# Patient Record
Sex: Male | Born: 1965 | Race: White | Hispanic: No | Marital: Single | Smoking: Never smoker
Health system: Southern US, Community
[De-identification: ages and names within clinical notes are randomized; demographics above are authoritative.]

## PROBLEM LIST (undated history)

## (undated) DIAGNOSIS — M5430 Sciatica, unspecified side: Secondary | ICD-10-CM

## (undated) DIAGNOSIS — I10 Essential (primary) hypertension: Secondary | ICD-10-CM

---

## 2018-12-18 ENCOUNTER — Encounter (HOSPITAL_COMMUNITY): Payer: Self-pay | Admitting: Emergency Medicine

## 2018-12-18 ENCOUNTER — Emergency Department (HOSPITAL_COMMUNITY)
Admission: EM | Admit: 2018-12-18 | Discharge: 2018-12-18 | Disposition: A | Discharge status: Home / Self Care | Payer: Self-pay | Attending: Emergency Medicine | Admitting: Emergency Medicine

## 2018-12-18 ENCOUNTER — Other Ambulatory Visit: Payer: Self-pay

## 2018-12-18 DIAGNOSIS — R7989 Other specified abnormal findings of blood chemistry: Secondary | ICD-10-CM | POA: Insufficient documentation

## 2018-12-18 DIAGNOSIS — R112 Nausea with vomiting, unspecified: Secondary | ICD-10-CM | POA: Insufficient documentation

## 2018-12-18 DIAGNOSIS — I10 Essential (primary) hypertension: Secondary | ICD-10-CM | POA: Insufficient documentation

## 2018-12-18 HISTORY — DX: Sciatica, unspecified side: M54.30

## 2018-12-18 HISTORY — DX: Essential (primary) hypertension: I10

## 2018-12-18 LAB — CBC
HCT: 44.2 % (ref 39.0–52.0)
Hemoglobin: 13.4 g/dL (ref 13.0–17.0)
MCH: 26.7 pg (ref 26.0–34.0)
MCHC: 30.3 g/dL (ref 30.0–36.0)
MCV: 88.2 fL (ref 80.0–100.0)
Platelets: 507 10*3/uL — ABNORMAL HIGH (ref 150–400)
RBC: 5.01 MIL/uL (ref 4.22–5.81)
RDW: 14.6 % (ref 11.5–15.5)
WBC: 15.1 10*3/uL — ABNORMAL HIGH (ref 4.0–10.5)
nRBC: 0 % (ref 0.0–0.2)

## 2018-12-18 LAB — COMPREHENSIVE METABOLIC PANEL
ALT: 26 U/L (ref 0–44)
AST: 43 U/L — ABNORMAL HIGH (ref 15–41)
Albumin: 3.1 g/dL — ABNORMAL LOW (ref 3.5–5.0)
Alkaline Phosphatase: 819 U/L — ABNORMAL HIGH (ref 38–126)
Anion gap: 19 — ABNORMAL HIGH (ref 5–15)
BUN: 19 mg/dL (ref 6–20)
CO2: 20 mmol/L — ABNORMAL LOW (ref 22–32)
Calcium: 9.6 mg/dL (ref 8.9–10.3)
Chloride: 100 mmol/L (ref 98–111)
Creatinine, Ser: 1.26 mg/dL — ABNORMAL HIGH (ref 0.61–1.24)
GFR calc Af Amer: >60 mL/min (ref >60)
GFR calc non Af Amer: >60 mL/min (ref >60)
Glucose, Bld: 82 mg/dL (ref 70–99)
Potassium: 4 mmol/L (ref 3.5–5.1)
Sodium: 139 mmol/L (ref 135–145)
Total Bilirubin: 1.7 mg/dL — ABNORMAL HIGH (ref 0.3–1.2)
Total Protein: 7.8 g/dL (ref 6.5–8.1)

## 2018-12-18 LAB — LIPASE, BLOOD: Lipase: 22 U/L (ref 11–51)

## 2018-12-18 MED ORDER — ONDANSETRON HCL 4 MG/2ML IJ SOLN
4.0000 mg | Freq: Once | INTRAMUSCULAR | Status: AC
  Administered 2018-12-18: 20:00:00 4 mg via INTRAVENOUS
  Filled 2018-12-18: qty 2

## 2018-12-18 MED ORDER — SODIUM CHLORIDE 0.9 % IV BOLUS
1000.0000 mL | Freq: Once | INTRAVENOUS | Status: AC
  Administered 2018-12-18: 1000 mL via INTRAVENOUS

## 2018-12-18 MED ORDER — ONDANSETRON 4 MG PO TBDP: 4.0000 mg | ORAL_TABLET | Freq: Three times a day (TID) | ORAL | 0 refills | Status: DC | PRN

## 2018-12-18 NOTE — Discharge Instructions (Signed)
Please read - discharge instructions and summary  As we discussed today in the emergency department, you are showing signs of mild dehydration, including a slightly abnormal creatinine level (level 1.26).  Creatinine is a measurement of your kidney function.  It is likely abnormal because of your poor fluid intake.  Therefore recommending that he continue to make efforts to keep drinking water, Gatorade, or any other fluids at home.  It is very important that your primary care doctor recheck this level in the next 1 to 2 weeks to make sure that your kidneys are okay.  We also discussed 1 of the other abnormalities in your blood work, which is your alkaline phosphatase level.  This was abnormally high.  This may be a sign of issues with your gallbladder.  We offered you an ultrasound of your gallbladder but he did not want the ultrasound done at this time.  We explained to you that an infected or distended gallbladder could be something that needs urgent surgery.  I recommended you get the scan done as an outpatient and you told us that you would.  If you develop worsening abdominal pain, fevers, intractable vomiting or you cannot keep down fluids, return to the emergency department immediately.  Finally we gave you a GI doctor's number to establish follow-up care for your vomiting.

## 2018-12-18 NOTE — ED Provider Notes (Signed)
[facility] DEPT Provider Note   CSN: [id number] Arrival date & time: 12/18/18  1321     History   Chief Complaint Chief Complaint  Patient presents with  . Emesis    HPI Joel Maxwell is a 53 y.o. male with a history of static and hypertension presenting to the emergency department abdominal pain and vomiting.  He reports episodes of nausea and vomiting after eating for the past 5 weeks.  He says this been occurring on a daily basis.  He says it is worse after eating solid food.  He typically feels that he is to vomit within an hour of eating food.   This is never happened to him before.  He denies any abdominal pain except for a sense of "fullness" prior to vomiting.  He otherwise has no focal abdominal pain.  He is passing gas and having bowel movements daily.  He has no dysuria hematuria or problems urinating.  He denies choking sensation or feeling food is getting stuck in his throat.  He has not seen a GI doctor.  He currently is seeing a chiropractor for sciatica and back problems.  He denies any abdominal surgical history.  He says he has been taking Advil up to 400 mg every 6 hours for the past several days, which takes his back and abdominal pain from a 10 down to about a 4 or 5.  He came to the emergency department today because he attempted to take Advil and eat a small meal, but immediately vomited it up.  He says he is sick of this.    HPI  Past Medical History:  Diagnosis Date  . Hypertension   . Sciatica     There are no active problems to display for this patient.   History reviewed. No pertinent surgical history.      Home Medications    Prior to Admission medications   Medication Sig Start Date End Date Taking? Authorizing Provider  ondansetron (ZOFRAN ODT) 4 MG disintegrating tablet Take 1 tablet (4 mg total) by mouth every 8 (eight) hours as needed for up to 15 doses for nausea or vomiting. 12/18/18   Octave Montrose, Carola Rhine, MD    Family History No family history on file.  Social History Social History   Tobacco Use  . Smoking status: Never Smoker  . Smokeless tobacco: Never Used  Substance Use Topics  . Alcohol use: Not on file  . Drug use: Not on file     Allergies   Patient has no known allergies.   Review of Systems Review of Systems  Constitutional: Negative for chills and fever.  Respiratory: Negative for cough and shortness of breath.   Cardiovascular: Negative for chest pain and palpitations.  Gastrointestinal: Positive for abdominal pain and nausea. Negative for constipation, diarrhea and vomiting.  Genitourinary: Negative for dysuria and hematuria.  Skin: Negative for pallor and rash.  Neurological: Negative for seizures and syncope.  Psychiatric/Behavioral: Negative for agitation and confusion.  All other systems reviewed and are negative.    Physical Exam Updated Vital Signs BP (!) 152/101 (BP Location: Right Arm)   Pulse (!) 107   Temp 97.9 F (36.6 C) (Oral)   Resp 16   SpO2 96%   Physical Exam Vitals signs and nursing note reviewed.  Constitutional:      Appearance: He is well-developed.  HENT:     Head: Normocephalic and atraumatic.  Eyes:     Conjunctiva/sclera: Conjunctivae normal.  Neck:     Musculoskeletal: Neck supple.  Cardiovascular:     Rate and Rhythm: Normal rate and regular rhythm.     Pulses: Normal pulses.     Heart sounds: No murmur.  Pulmonary:     Effort: Pulmonary effort is normal. No respiratory distress.     Breath sounds: Normal breath sounds.  Abdominal:     General: Bowel sounds are normal.     Palpations: Abdomen is soft.     Tenderness: There is no abdominal tenderness. There is no right CVA tenderness, left CVA tenderness, guarding or rebound. Negative signs include Murphy's sign, Rovsing's sign, McBurney's sign and psoas sign.     Hernia: No hernia is present.  Skin:    General: Skin is warm and dry.  Neurological:      General: No focal deficit present.     Mental Status: He is alert and oriented to person, place, and time.  Psychiatric:        Mood and Affect: Mood normal.        Behavior: Behavior normal.      ED Treatments / Results  Labs (all labs ordered are listed, but only abnormal results are displayed) Labs Reviewed  COMPREHENSIVE METABOLIC PANEL - Abnormal; Notable for the following components:      Result Value   CO2 20 (*)    Creatinine, Ser 1.26 (*)    Albumin 3.1 (*)    AST 43 (*)    Alkaline Phosphatase 819 (*)    Total Bilirubin 1.7 (*)    Anion gap 19 (*)    All other components within normal limits  CBC - Abnormal; Notable for the following components:   WBC 15.1 (*)    Platelets 507 (*)    All other components within normal limits  LIPASE, BLOOD    EKG None  Radiology No results found.  Procedures Procedures (including critical care time)  Medications Ordered in ED Medications  ondansetron (ZOFRAN) injection 4 mg (4 mg Intravenous Given 12/18/18 1931)  sodium chloride 0.9 % bolus 1,000 mL (0 mLs Intravenous Stopped 12/18/18 2015)     Initial Impression / Assessment and Plan / ED Course  I have reviewed the triage vital signs and the nursing notes.  Pertinent labs & imaging results that were available during my care of the patient were reviewed by me and considered in my medical decision making (see chart for details).  53 yo male presenting with persistent nausea and vomiting after meals for 5 weeks.  Difficulty keeping down PO, although fluids are better than solid foods.  He is well appearing on exam, does not appear dehydrated, and has no focal abdominal tenderness.  I suspect he may have gastroparesis although it is unclear what the inciting issue may be.  He reports no prior illness before this started.  His other doctors believe it may be related to sciatica pain (he has very bad back pain at baseline), but he is unsure whether his vomiting is triggered by  acute pain.  He simply feels he cannot keep food down.  He's passing gas and having BM - lower suspicion for SBO.  He appears quite comfortable on exam, with no abdominal tenderness.    Labs demonstrated elevated alk phos - possible gall bladder disease?  Will obtain RUQ U/S  Otherwise we discussed him following up with a GI doctor for further GI motility studies and workup.  He agrees with this.  He states, "I'm fine at home  doc, I just need something for nausea when I eat."  We also discussed limiting his heavy solid food intake as he has been favoring steak, fast food, and other meats.  I suggested liquid diet, BOOST and ensures, and he agrees with this plan.  Clinical Course as of Dec 18 44  Sat Dec 18, 2018  1908 The patient is refusing ultrasound for religious reasons.  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  I explained to him that his labs show an abnormality including high alkaline phosphatase which could be a sign of gallbladder disease.  This may be causing his repeat nausea and vomiting.  I explained that his gallbladder may be inflamed and need surgical removal, even if he has no symptoms at this point in time.  It could lead to a more serious or life threatening infection. He verbalized understanding of this but still does not want any imaging done, including CT scan.  He says "I feel fine, I do not need it today".  I believe he has capacity to make this decision and demonstrates an understanding of the risks.  I will give him a liter of fluids and IV Zofran.  I will give him his labs to follow-up with his primary care doctor, as well as a GI referral.  I strongly encouraged him to get the ultrasound done as an outpatient and ask his PCP about this.    [MT]    Clinical Course User Index [MT] Wyvonnia Dusky, MD      Final Clinical Impressions(s) / ED Diagnoses   Final diagnoses:  Non-intractable vomiting with nausea, unspecified  vomiting type  Elevated serum creatinine    ED Discharge Orders         Ordered    ondansetron (ZOFRAN ODT) 4 MG disintegrating tablet  Every 8 hours PRN     12/18/18 1959           Wyvonnia Dusky, MD 12/19/18 [phone]

## 2018-12-18 NOTE — ED Triage Notes (Signed)
Pt reports vomiting everyday for past 5.5 weeks. Lost weight over it. Pt sees a chiropractor and they feel the pain from sciatica is what is causing the vomiting. Reports doesn't have PCP.

## 2019-02-04 ENCOUNTER — Emergency Department (HOSPITAL_COMMUNITY): Payer: Self-pay

## 2019-02-04 ENCOUNTER — Encounter (HOSPITAL_COMMUNITY): Payer: Self-pay | Admitting: Emergency Medicine

## 2019-02-04 ENCOUNTER — Observation Stay (HOSPITAL_COMMUNITY)
Admission: EM | Admit: 2019-02-04 | Discharge: 2019-02-05 | Disposition: A | Discharge status: Home / Self Care | Payer: Self-pay | Attending: Family Medicine | Admitting: Family Medicine

## 2019-02-04 DIAGNOSIS — Z20828 Contact with and (suspected) exposure to other viral communicable diseases: Secondary | ICD-10-CM | POA: Insufficient documentation

## 2019-02-04 DIAGNOSIS — M549 Dorsalgia, unspecified: Secondary | ICD-10-CM

## 2019-02-04 DIAGNOSIS — C787 Secondary malignant neoplasm of liver and intrahepatic bile duct: Secondary | ICD-10-CM | POA: Insufficient documentation

## 2019-02-04 DIAGNOSIS — K769 Liver disease, unspecified: Secondary | ICD-10-CM

## 2019-02-04 DIAGNOSIS — G8929 Other chronic pain: Principal | ICD-10-CM | POA: Insufficient documentation

## 2019-02-04 DIAGNOSIS — E876 Hypokalemia: Secondary | ICD-10-CM

## 2019-02-04 DIAGNOSIS — C187 Malignant neoplasm of sigmoid colon: Secondary | ICD-10-CM | POA: Insufficient documentation

## 2019-02-04 DIAGNOSIS — C7972 Secondary malignant neoplasm of left adrenal gland: Secondary | ICD-10-CM | POA: Insufficient documentation

## 2019-02-04 DIAGNOSIS — E871 Hypo-osmolality and hyponatremia: Secondary | ICD-10-CM | POA: Insufficient documentation

## 2019-02-04 DIAGNOSIS — N179 Acute kidney failure, unspecified: Secondary | ICD-10-CM

## 2019-02-04 DIAGNOSIS — M545 Low back pain, unspecified: Secondary | ICD-10-CM

## 2019-02-04 DIAGNOSIS — R112 Nausea with vomiting, unspecified: Secondary | ICD-10-CM | POA: Insufficient documentation

## 2019-02-04 DIAGNOSIS — I10 Essential (primary) hypertension: Secondary | ICD-10-CM | POA: Insufficient documentation

## 2019-02-04 DIAGNOSIS — Z66 Do not resuscitate: Secondary | ICD-10-CM | POA: Insufficient documentation

## 2019-02-04 DIAGNOSIS — M5441 Lumbago with sciatica, right side: Secondary | ICD-10-CM | POA: Insufficient documentation

## 2019-02-04 DIAGNOSIS — R634 Abnormal weight loss: Secondary | ICD-10-CM | POA: Insufficient documentation

## 2019-02-04 DIAGNOSIS — R918 Other nonspecific abnormal finding of lung field: Secondary | ICD-10-CM

## 2019-02-04 DIAGNOSIS — C7971 Secondary malignant neoplasm of right adrenal gland: Secondary | ICD-10-CM | POA: Insufficient documentation

## 2019-02-04 DIAGNOSIS — R948 Abnormal results of function studies of other organs and systems: Secondary | ICD-10-CM

## 2019-02-04 DIAGNOSIS — R7989 Other specified abnormal findings of blood chemistry: Secondary | ICD-10-CM | POA: Insufficient documentation

## 2019-02-04 DIAGNOSIS — C799 Secondary malignant neoplasm of unspecified site: Secondary | ICD-10-CM | POA: Diagnosis present

## 2019-02-04 DIAGNOSIS — C7951 Secondary malignant neoplasm of bone: Secondary | ICD-10-CM | POA: Insufficient documentation

## 2019-02-04 DIAGNOSIS — C7801 Secondary malignant neoplasm of right lung: Secondary | ICD-10-CM | POA: Insufficient documentation

## 2019-02-04 DIAGNOSIS — Z79899 Other long term (current) drug therapy: Secondary | ICD-10-CM | POA: Insufficient documentation

## 2019-02-04 LAB — PROTIME-INR
INR: 1.4 — ABNORMAL HIGH (ref 0.8–1.2)
Prothrombin Time: 16.8 seconds — ABNORMAL HIGH (ref 11.4–15.2)

## 2019-02-04 LAB — CBC WITH DIFFERENTIAL/PLATELET
Abs Immature Granulocytes: 0.2 10*3/uL — ABNORMAL HIGH (ref 0.00–0.07)
Basophils Absolute: 0.1 10*3/uL (ref 0.0–0.1)
Basophils Relative: 0 %
Eosinophils Absolute: 0.2 10*3/uL (ref 0.0–0.5)
Eosinophils Relative: 1 %
HCT: 40.9 % (ref 39.0–52.0)
Hemoglobin: 12.8 g/dL — ABNORMAL LOW (ref 13.0–17.0)
Immature Granulocytes: 1 %
Lymphocytes Relative: 8 %
Lymphs Abs: 1.5 10*3/uL (ref 0.7–4.0)
MCH: 26.6 pg (ref 26.0–34.0)
MCHC: 31.3 g/dL (ref 30.0–36.0)
MCV: 85 fL (ref 80.0–100.0)
Monocytes Absolute: 1.2 10*3/uL — ABNORMAL HIGH (ref 0.1–1.0)
Monocytes Relative: 6 %
Neutro Abs: 15.2 10*3/uL — ABNORMAL HIGH (ref 1.7–7.7)
Neutrophils Relative %: 84 %
Platelets: 325 10*3/uL (ref 150–400)
RBC: 4.81 MIL/uL (ref 4.22–5.81)
RDW: 18.6 % — ABNORMAL HIGH (ref 11.5–15.5)
WBC: 18.3 10*3/uL — ABNORMAL HIGH (ref 4.0–10.5)
nRBC: 0 % (ref 0.0–0.2)

## 2019-02-04 LAB — COMPREHENSIVE METABOLIC PANEL
ALT: 31 U/L (ref 0–44)
AST: 124 U/L — ABNORMAL HIGH (ref 15–41)
Albumin: 2.5 g/dL — ABNORMAL LOW (ref 3.5–5.0)
Alkaline Phosphatase: 1807 U/L — ABNORMAL HIGH (ref 38–126)
Anion gap: 15 (ref 5–15)
BUN: 35 mg/dL — ABNORMAL HIGH (ref 6–20)
CO2: 20 mmol/L — ABNORMAL LOW (ref 22–32)
Calcium: 10.5 mg/dL — ABNORMAL HIGH (ref 8.9–10.3)
Chloride: 94 mmol/L — ABNORMAL LOW (ref 98–111)
Creatinine, Ser: 1.7 mg/dL — ABNORMAL HIGH (ref 0.61–1.24)
GFR calc Af Amer: 52 mL/min — ABNORMAL LOW (ref >60)
GFR calc non Af Amer: 45 mL/min — ABNORMAL LOW (ref >60)
Glucose, Bld: 103 mg/dL — ABNORMAL HIGH (ref 70–99)
Potassium: 3.3 mmol/L — ABNORMAL LOW (ref 3.5–5.1)
Sodium: 129 mmol/L — ABNORMAL LOW (ref 135–145)
Total Bilirubin: 2.3 mg/dL — ABNORMAL HIGH (ref 0.3–1.2)
Total Protein: 6.8 g/dL (ref 6.5–8.1)

## 2019-02-04 LAB — URINALYSIS, ROUTINE W REFLEX MICROSCOPIC
Bilirubin Urine: NEGATIVE
Glucose, UA: NEGATIVE mg/dL
Hgb urine dipstick: NEGATIVE
Ketones, ur: 5 mg/dL — AB
Leukocytes,Ua: NEGATIVE
Nitrite: NEGATIVE
Protein, ur: NEGATIVE mg/dL
Specific Gravity, Urine: >1.046 — ABNORMAL HIGH (ref 1.005–1.030)
pH: 6 (ref 5.0–8.0)

## 2019-02-04 LAB — PSA: Prostatic Specific Antigen: 1.17 ng/mL (ref 0.00–4.00)

## 2019-02-04 LAB — LIPASE, BLOOD: Lipase: 18 U/L (ref 11–51)

## 2019-02-04 LAB — SARS CORONAVIRUS 2 BY RT PCR (HOSPITAL ORDER, PERFORMED IN ~~LOC~~ HOSPITAL LAB): SARS Coronavirus 2: NEGATIVE

## 2019-02-04 MED ORDER — IOHEXOL 300 MG/ML  SOLN
100.0000 mL | Freq: Once | INTRAMUSCULAR | Status: AC | PRN
  Administered 2019-02-04: 12:00:00 100 mL via INTRAVENOUS

## 2019-02-04 MED ORDER — ONDANSETRON HCL 4 MG PO TABS: 4.0000 mg | ORAL_TABLET | Freq: Four times a day (QID) | ORAL | Status: DC | PRN

## 2019-02-04 MED ORDER — SODIUM CHLORIDE 0.9 % IV BOLUS
1000.0000 mL | Freq: Once | INTRAVENOUS | Status: AC
  Administered 2019-02-04: 10:00:00 1000 mL via INTRAVENOUS

## 2019-02-04 MED ORDER — ACETAMINOPHEN 325 MG PO TABS
650.0000 mg | ORAL_TABLET | Freq: Four times a day (QID) | ORAL | Status: DC | PRN
  Administered 2019-02-05: 09:00:00 650 mg via ORAL
  Filled 2019-02-04: qty 2

## 2019-02-04 MED ORDER — ONDANSETRON HCL 4 MG/2ML IJ SOLN
4.0000 mg | Freq: Four times a day (QID) | INTRAMUSCULAR | Status: DC | PRN
  Administered 2019-02-04: 4 mg via INTRAVENOUS
  Filled 2019-02-04: qty 2

## 2019-02-04 MED ORDER — SODIUM CHLORIDE 0.9 % IV SOLN
INTRAVENOUS | Status: DC
  Administered 2019-02-04 – 2019-02-05 (×2): via INTRAVENOUS

## 2019-02-04 MED ORDER — SODIUM CHLORIDE (PF) 0.9 % IJ SOLN
INTRAMUSCULAR | Status: AC
  Filled 2019-02-04: qty 50

## 2019-02-04 MED ORDER — POTASSIUM CHLORIDE CRYS ER 20 MEQ PO TBCR
40.0000 meq | EXTENDED_RELEASE_TABLET | Freq: Once | ORAL | Status: AC
  Administered 2019-02-04: 40 meq via ORAL
  Filled 2019-02-04: qty 2

## 2019-02-04 MED ORDER — ACETAMINOPHEN 650 MG RE SUPP: 650.0000 mg | Freq: Four times a day (QID) | RECTAL | Status: DC | PRN

## 2019-02-04 MED ORDER — METOCLOPRAMIDE HCL 5 MG/ML IJ SOLN
5.0000 mg | Freq: Once | INTRAMUSCULAR | Status: AC
  Administered 2019-02-04: 5 mg via INTRAVENOUS
  Filled 2019-02-04: qty 2

## 2019-02-04 MED ORDER — SODIUM CHLORIDE 0.9 % IV BOLUS
1000.0000 mL | Freq: Once | INTRAVENOUS | Status: AC
  Administered 2019-02-04: 12:00:00 1000 mL via INTRAVENOUS

## 2019-02-04 NOTE — ED Triage Notes (Signed)
Pt c/o sciatic pain affecting his gait x 6 months. Been vomiting x 4 months.

## 2019-02-04 NOTE — Consult Note (Addendum)
[facility]  [phone] [phone]   MEDICAL ONCOLOGY - INITIAL CONSULTATION  Referral MD:  Shelby Dubin, PA-C  Reason for Referral: Metastatic cancer  HPI: Mr. Stennis is a 53 year old male with no significant past medical history.  He presented to the emergency room for evaluation of nausea and vomiting over the past 4 months.  He was seen for the same symptoms and referred to gastroenterology as an outpatient but did not follow-up.  He has been having chronic back pain over the past 6 months and has been seen by a chiropractor. Back pain radiates down his posterior right buttocks into his posterior leg.  Patient states pain has been improving since seeing the chiropractor. Labs obtained in the ER show an elevated WBC at 18.3, sodium 129, potassium 3.3, BUN 35, creatinine 1.7, calcium 10.5 (corrected calcium 11.3), albumin 2.5, AST 124, alkaline phosphatase 1807, total bilirubin 2.3.  The patient initially had a chest and abdominal x-ray which showed bilateral hilar fullness suggesting adenopathy, pulmonary nodules bilaterally.  Right upper quadrant ultrasound showed heterogeneous liver with multiple mixed echogenicity hepatic lesions measuring up to 15 cm compatible with underlying liver metastases, trace perihepatic ascites.  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  No obstruction noted but the lesion is circumferential and fairly constricting appearance.  He also had a CT of the L-spine which showed an abnormal 5.5 x 3 cm lytic bone lesion in the right posterior ilium with pathologic fracture and an 8.3 x 8.2 cm soft tissue mass around the lytic bone lesion.  Soft tissue mass extends posteriorly which is in  close proximity with the right sciatic nerve, findings consistent with metastatic disease.  There is also a 6 mm nonspecific lucent lesion at the L2 vertebral body which may reflect hemangioma, but aggressive bone lesion cannot be excluded given the right iliac bone lesion.  When seen today, the patient reports that he has had low back pain which radiates to his right leg for several months.  He states that his pain really has not changed and has not worsened.  However, he states that he has been taking one 200 mg ibuprofen 4 times a day.  He also has a history of nausea and vomiting for the past 4-1/2 months.  He states that he was seen in the emergency room for similar symptoms and was referred to gastroenterology but did not follow-up.  Over the past week, he has not been able to keep anything down.  He has been taking 5-6 times per day. He reports weight loss of approximately 80 pounds in the past 4 months.  He denies headaches and dizziness.  Denies night sweats.  He denies chest pain and shortness of breath.  He reports a nonproductive cough, but no hemoptysis.  He denies any abdominal pain associated with his nausea and vomiting.  Denies constipation and diarrhea.  He does report intermittent blood in his stool which she attributes to hemorrhoids.  He states that he has had blood in his stool intermittently since his 63s.  Denies difficulty controlling his bowel or bladder.  No hematuria.  Patient also states he has had some confusion over the past week.  States that his mind keeps making things up.  The patient is married his  wife is currently living in [place] and cannot return to the [place] currently secondary to the COVID-19 pandemic.  The patient's mother lives locally.  He states that he cannot drive because he cannot sit long enough secondary to pain.  His mother is able to help him get to appointments.  He states that he has 2-3 adult children who live in the [place], [place] area.  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  Denies history of alcohol and tobacco use.  Reports having a family history of a maternal aunt with cancer but he does not know what kind of cancer.  Denies history of colorectal or other GI malignancies.  Medical oncology was asked see the patient to make recommendations regarding his metastatic cancer.    Past Medical History:  Diagnosis Date  . Hypertension   . Sciatica   :  History reviewed. No pertinent surgical history.:  Current Facility-Administered Medications  Medication Dose Route Frequency Provider Last Rate Last Dose  . sodium chloride (PF) 0.9 % injection            Current Outpatient Medications  Medication Sig Dispense Refill  . calcium carbonate (TUMS - DOSED IN MG ELEMENTAL CALCIUM) 500 MG chewable tablet Chew 1 tablet by mouth 3 (three) times daily as needed for indigestion or heartburn.    Marland Kitchen ibuprofen (ADVIL) 200 MG tablet Take 200 mg by mouth every 6 (six) hours as needed for fever, headache or moderate pain.    Marland Kitchen ondansetron (ZOFRAN ODT) 4 MG disintegrating tablet Take 1 tablet (4 mg total) by mouth every 8 (eight) hours as needed for up to 15 doses for nausea or vomiting. (Patient not taking: Reported on 02/04/2019) 15 tablet 0     No Known Allergies:  History reviewed. No pertinent family history.:  Social History   Socioeconomic History  . Marital status: Single    Spouse name: Not on file  . Number of children: Not on file  . Years of education: Not on file  . Highest education level: Not on file  Occupational History  . Not on file  Social Needs  . Financial resource strain: Not on file  . Food insecurity    Worry: Not on file    Inability: Not on file  . Transportation needs    Medical: Not on file    Non-medical: Not on file  Tobacco Use  . Smoking status: Never Smoker  . Smokeless tobacco: Never Used  Substance and Sexual Activity  . Alcohol use: Not on file  . Drug use: Not on file  .  Sexual activity: Not on file  Lifestyle  . Physical activity    Days per week: Not on file    Minutes per session: Not on file  . Stress: Not on file  Relationships  . Social Herbalist on phone: Not on file    Gets together: Not on file    Attends religious service: Not on file    Active member of club or organization: Not on file    Attends meetings of clubs or organizations: Not on file    Relationship status: Not on file  . Intimate partner violence    Fear of current or ex partner: Not on file    Emotionally abused: Not on file    Physically abused: Not on file    Forced sexual activity: Not on file  Other Topics Concern  . Not on  file  Social History Narrative  . Not on file  :  Review of Systems: A comprehensive 14 point review of systems was negative except as noted in the HPI.  Exam: Patient Vitals for the past 24 hrs:  BP Temp Temp src Pulse Resp SpO2  02/04/19 1230 116/88 - - 100 16 96 %  02/04/19 1215 - - - 99 - 97 %  02/04/19 1200 119/85 - - 99 18 99 %  02/04/19 1200 122/89 - - 98 - 99 %  02/04/19 1037 - 97.6 F (36.4 C) Oral - - -  02/04/19 1000 114/85 - - (!) 104 18 97 %  02/04/19 0912 125/90 - - (!) 115 18 100 %    General: Awake and alert, no distress Eyes:  no scleral icterus.   ENT: Oral mucosa is dry, no mucositis or thrush. Neck was without thyromegaly.   Lymphatics:  Negative cervical, supraclavicular or axillary adenopathy.   Respiratory: lungs were clear bilaterally without wheezing or crackles.   Cardiovascular:  Regular rate and rhythm, S1/S2, without murmur, rub or gallop.  There was no pedal edema.   GI: Positive bowel sounds, mild distention, no pain with palpation.  Marked hepatomegaly is noted. Musculoskeletal: Strength 5/5 in all 4 extremities Skin exam was without echymosis, petichae.   Neuro exam was nonfocal. Patient was alert and oriented.  Attention was good.  Language was appropriate.  Mood was normal without  depression.  Speech was not pressured.  Thought content was not tangential.     Lab Results  Component Value Date   WBC 18.3 (H) 02/04/2019   HGB 12.8 (L) 02/04/2019   HCT 40.9 02/04/2019   PLT 325 02/04/2019   GLUCOSE 103 (H) 02/04/2019   ALT 31 02/04/2019   AST 124 (H) 02/04/2019   NA 129 (L) 02/04/2019   K 3.3 (L) 02/04/2019   CL 94 (L) 02/04/2019   CREATININE 1.70 (H) 02/04/2019   BUN 35 (H) 02/04/2019   CO2 20 (L) 02/04/2019    Ct Chest W Contrast  Result Date: 02/04/2019 CLINICAL DATA:  Liver lesions seen on recent ultrasound examination. EXAM: CT CHEST, ABDOMEN, AND PELVIS WITH CONTRAST TECHNIQUE: Multidetector CT imaging of the chest, abdomen and pelvis was performed following the standard protocol during bolus administration of intravenous contrast. CONTRAST:  159mL OMNIPAQUE IOHEXOL 300 MG/ML  SOLN COMPARISON:  Abdominal ultrasound 02/04/2019 FINDINGS: CT CHEST FINDINGS Cardiovascular: The heart is normal in size. No pericardial effusion. The aorta is normal in caliber. No dissection. The branch vessels are patent. No atherosclerotic calcifications. The pulmonary arteries appear normal. Mediastinum/Nodes: Borderline enlarged subcarinal lymph nodes suspicious for metastatic disease. The largest node measures 14.5 mm on image number 27. The esophagus is grossly normal. Lungs/Pleura: Numerous pulmonary metastatic nodules are noted in both lungs. 18.5 mm right lower lobe nodule on image number 69. 16.5 mm left lower lobe nodule on image number 72. 6.5 mm left upper lobe nodule on image number 36. 7.5 mm right upper lobe nodule on image number 41 Small right pleural effusion, likely malignant. Musculoskeletal: No significant bony findings. No lytic or blastic bone lesions involving the chest. No supraclavicular or axillary adenopathy.  No chest wall mass. CT ABDOMEN PELVIS FINDINGS Hepatobiliary: Innumerable large irregular masses throughout both lobes of the liver. The largest lesion  occupies a good portion of the right lobe and measures 14.5 x 13 cm on image number 58 of series 2. Caudate lobe lesion measures 8.3 x 6.5 cm on image number  58 The largest left lobe lesion measures 10.5 x 8.2 cm on image number 47. The portal and hepatic veins are patent but the vessels are significantly compressed. There is severe compression of the intrahepatic IVC. It is almost completely obliterated. The gallbladder is grossly normal.  No common bile duct dilatation. Pancreas: No mass, inflammation or ductal dilatation. Spleen: Normal size.  Indeterminate 18 mm lesion. Adrenals/Urinary Tract: Bilateral adrenal gland metastasis. The right lesion measures 5.5 x 2.7 cm and the left lesion measures 6.6 x 6.5 cm. Both kidneys are unremarkable. The bladder appears normal. Stomach/Bowel: The stomach is displaced laterally by the enlarged liver. No gastric lesions are identified. The duodenum and small bowel are unremarkable. No obstructive findings or masses. Circumferential enhancing mass involving the mid sigmoid colon most consistent with patient's primary neoplasm. This measures approximately 5 x 5 cm. There are adjacent lymph nodes in the sigmoid mesocolon likely metastatic disease. Vascular/Lymphatic: No retroperitoneal lymphadenopathy. There is periportal and celiac axis adenopathy. Index node on image number 66 of series 2 measures 2 x 2 cm. Reproductive: The prostate gland and seminal vesicles are unremarkable. Other: Small amount of free pelvic fluid noted. I do not see any obvious omental caking or peritoneal surface lesions. No inguinal mass or adenopathy. Musculoskeletal: There is a large destructive lesion involving the right iliac bone with an associated necrotic appearing soft tissue mass extending into the extraperitoneal pelvis and into the gluteal muscles. IMPRESSION: 1. Widespread metastatic disease involving the lungs, liver, adrenal glands and abdominal lymph nodes. There is also a single large  destructive metastatic bone lesion involving the right iliac bone. 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 No obstruction at this point but the lesion is circumferential and fairly constricting appearing. 2. The right iliac bone lesion may be the easiest and safest to biopsy. Interventional radiology consultation suggested. GI and GI oncology consults also suggested Electronically Signed   By: Marijo Sanes M.D.   On: 02/04/2019 12:36   Ct Abdomen Pelvis W Contrast  Result Date: 02/04/2019 CLINICAL DATA:  Liver lesions seen on recent ultrasound examination. EXAM: CT CHEST, ABDOMEN, AND PELVIS WITH CONTRAST TECHNIQUE: Multidetector CT imaging of the chest, abdomen and pelvis was performed following the standard protocol during bolus administration of intravenous contrast. CONTRAST:  114mL OMNIPAQUE IOHEXOL 300 MG/ML  SOLN COMPARISON:  Abdominal ultrasound 02/04/2019 FINDINGS: CT CHEST FINDINGS Cardiovascular: The heart is normal in size. No pericardial effusion. The aorta is normal in caliber. No dissection. The branch vessels are patent. No atherosclerotic calcifications. The pulmonary arteries appear normal. Mediastinum/Nodes: Borderline enlarged subcarinal lymph nodes suspicious for metastatic disease. The largest node measures 14.5 mm on image number 27. The esophagus is grossly normal. Lungs/Pleura: Numerous pulmonary metastatic nodules are noted in both lungs. 18.5 mm right lower lobe nodule on image number 69. 16.5 mm left lower lobe nodule on image number 72. 6.5 mm left upper lobe nodule on image number 36. 7.5 mm right upper lobe nodule on image number 41 Small right pleural effusion, likely malignant. Musculoskeletal: No significant bony findings. No lytic or blastic bone lesions involving the chest. No supraclavicular or axillary adenopathy.  No chest wall mass. CT ABDOMEN PELVIS FINDINGS Hepatobiliary: Innumerable large irregular masses throughout both lobes of the liver.  The largest lesion occupies a good portion of the right lobe and measures 14.5 x 13 cm on image number 58 of series 2. Caudate lobe lesion measures 8.3 x 6.5 cm on image number  58 The largest left lobe lesion measures 10.5 x 8.2 cm on image number 47. The portal and hepatic veins are patent but the vessels are significantly compressed. There is severe compression of the intrahepatic IVC. It is almost completely obliterated. The gallbladder is grossly normal.  No common bile duct dilatation. Pancreas: No mass, inflammation or ductal dilatation. Spleen: Normal size.  Indeterminate 18 mm lesion. Adrenals/Urinary Tract: Bilateral adrenal gland metastasis. The right lesion measures 5.5 x 2.7 cm and the left lesion measures 6.6 x 6.5 cm. Both kidneys are unremarkable. The bladder appears normal. Stomach/Bowel: The stomach is displaced laterally by the enlarged liver. No gastric lesions are identified. The duodenum and small bowel are unremarkable. No obstructive findings or masses. Circumferential enhancing mass involving the mid sigmoid colon most consistent with patient's primary neoplasm. This measures approximately 5 x 5 cm. There are adjacent lymph nodes in the sigmoid mesocolon likely metastatic disease. Vascular/Lymphatic: No retroperitoneal lymphadenopathy. There is periportal and celiac axis adenopathy. Index node on image number 66 of series 2 measures 2 x 2 cm. Reproductive: The prostate gland and seminal vesicles are unremarkable. Other: Small amount of free pelvic fluid noted. I do not see any obvious omental caking or peritoneal surface lesions. No inguinal mass or adenopathy. Musculoskeletal: There is a large destructive lesion involving the right iliac bone with an associated necrotic appearing soft tissue mass extending into the extraperitoneal pelvis and into the gluteal muscles. IMPRESSION: 1. Widespread metastatic disease involving the lungs, liver, adrenal glands and abdominal lymph nodes. There is  also a single large destructive metastatic bone lesion involving the right iliac bone. 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 No obstruction at this point but the lesion is circumferential and fairly constricting appearing. 2. The right iliac bone lesion may be the easiest and safest to biopsy. Interventional radiology consultation suggested. GI and GI oncology consults also suggested Electronically Signed   By: Marijo Sanes M.D.   On: 02/04/2019 12:36   Ct L-spine No Charge  Result Date: 02/04/2019 CLINICAL DATA:  Low back pain EXAM: CT LUMBAR SPINE WITHOUT CONTRAST TECHNIQUE: Multidetector CT imaging of the lumbar spine was performed without intravenous contrast administration. Multiplanar CT image reconstructions were also generated. COMPARISON:  None. FINDINGS: Segmentation: 5 lumbar type vertebrae. Alignment: Normal. Vertebrae: Vertebral body heights are maintained. No lumbar spine fracture. Abnormal 5.5 x 3 cm lytic bone lesion in the right posterior ilium with a pathologic fracture and a 8.3 x 8.2 cm soft tissue mass around the lytic bone lesion. Soft tissue mass extends posteriorly which is in close proximity to the right sciatic nerve. 6 mm nonspecific lucent lesion in the L2 vertebral body which may reflect a hemangioma, but a aggressive bone lesion cannot be excluded given the right iliac bone lesion. Paraspinal and other soft tissues: Negative. Disc levels: Disc spaces are maintained. No foraminal or central canal stenosis. IMPRESSION: 1. Abnormal 5.5 x 3 cm lytic bone lesion in the right posterior ilium with a pathologic fracture and a 8.3 x 8.2 cm soft tissue mass around the lytic bone lesion. Soft tissue mass extends posteriorly which is in close proximity to the right sciatic nerve. Findings are most consistent with metastatic disease. 2. 6 mm nonspecific lucent lesion in the L2 vertebral body which may reflect a hemangioma, but a aggressive bone lesion cannot be  excluded given the right iliac bone lesion. Electronically Signed   By: Kathreen Devoid   On: 02/04/2019 12:24  Dg Abdomen Acute W/chest  Result Date: 02/04/2019 CLINICAL DATA:  Abdominal pain.  Cough. EXAM: DG ABDOMEN ACUTE W/ 1V CHEST COMPARISON:  No prior. FINDINGS: Chest x-ray reveals bilateral hilar fullness suggesting adenopathy. Pulmonary nodules are noted bilaterally. Granulomatous disease could present in this fashion however these findings are worrisome for metastatic disease. Contrast-enhanced chest CT suggested to further evaluate. Bibasilar atelectasis/infiltrates noted. Thoracic spine scoliosis. Upright abdomen view is rotated. Liver appears prominent. No bowel distention. Stool noted throughout the colon. No free air. Tiny sclerotic density noted the left acetabulum, most likely bone island. Tiny pelvic calcification most likely phleboliths. IMPRESSION: 1. Bilateral hilar fullness suggesting adenopathy. Pulmonary nodules are noted bilaterally. Granulomatous disease could present in this fashion however these findings are worrisome for metastatic disease. Contrast-enhanced chest CT suggested to further evaluate. 2.  Bibasilar atelectasis/infiltrates. 3.  Liver appears prominent.  No bowel distention. Electronically Signed   By: [place]   On: 02/04/2019 10:39   US Abdomen Limited Ruq  Result Date: 02/04/2019 CLINICAL DATA:  Low back pain EXAM: ULTRASOUND ABDOMEN LIMITED RIGHT UPPER QUADRANT COMPARISON:  None available FINDINGS: Gallbladder: Normal wall thickness measuring 2.1 mm. No Murphy's sign or pericholecystic fluid. Gallbladder sludge and tiny subcentimeter gallstones noted measuring 3 mm. Common bile duct: Diameter: 2.1 mm Liver: Very heterogeneous liver with mixed echogenicity hepatic lesions throughout, large lesion in the right lobe measures 15 x 14 x 12 cm. No intrahepatic biliary dilatation. Portal vein is patent on color Doppler imaging with normal direction of blood flow  towards the liver. Other: Small amount of surrounding ascites noted. IMPRESSION: Heterogeneous liver with multiple mixed echogenicity hepatic lesions measuring up to 15 cm compatible with underlying liver metastases. Trace perihepatic ascites Cholelithiasis and gallbladder sludge Electronically Signed   By: Jerilynn Mages.  Shick M.D.   On: 02/04/2019 11:43     Ct Chest W Contrast  Result Date: 02/04/2019 CLINICAL DATA:  Liver lesions seen on recent ultrasound examination. EXAM: CT CHEST, ABDOMEN, AND PELVIS WITH CONTRAST TECHNIQUE: Multidetector CT imaging of the chest, abdomen and pelvis was performed following the standard protocol during bolus administration of intravenous contrast. CONTRAST:  133mL OMNIPAQUE IOHEXOL 300 MG/ML  SOLN COMPARISON:  Abdominal ultrasound 02/04/2019 FINDINGS: CT CHEST FINDINGS Cardiovascular: The heart is normal in size. No pericardial effusion. The aorta is normal in caliber. No dissection. The branch vessels are patent. No atherosclerotic calcifications. The pulmonary arteries appear normal. Mediastinum/Nodes: Borderline enlarged subcarinal lymph nodes suspicious for metastatic disease. The largest node measures 14.5 mm on image number 27. The esophagus is grossly normal. Lungs/Pleura: Numerous pulmonary metastatic nodules are noted in both lungs. 18.5 mm right lower lobe nodule on image number 69. 16.5 mm left lower lobe nodule on image number 72. 6.5 mm left upper lobe nodule on image number 36. 7.5 mm right upper lobe nodule on image number 41 Small right pleural effusion, likely malignant. Musculoskeletal: No significant bony findings. No lytic or blastic bone lesions involving the chest. No supraclavicular or axillary adenopathy.  No chest wall mass. CT ABDOMEN PELVIS FINDINGS Hepatobiliary: Innumerable large irregular masses throughout both lobes of the liver. The largest lesion occupies a good portion of the right lobe and measures 14.5 x 13 cm on image number 58 of series 2.  Caudate lobe lesion measures 8.3 x 6.5 cm on image number 58 The largest left lobe lesion measures 10.5 x 8.2 cm on image number 47. The portal and hepatic veins are patent but the vessels are significantly compressed. There is  severe compression of the intrahepatic IVC. It is almost completely obliterated. The gallbladder is grossly normal.  No common bile duct dilatation. Pancreas: No mass, inflammation or ductal dilatation. Spleen: Normal size.  Indeterminate 18 mm lesion. Adrenals/Urinary Tract: Bilateral adrenal gland metastasis. The right lesion measures 5.5 x 2.7 cm and the left lesion measures 6.6 x 6.5 cm. Both kidneys are unremarkable. The bladder appears normal. Stomach/Bowel: The stomach is displaced laterally by the enlarged liver. No gastric lesions are identified. The duodenum and small bowel are unremarkable. No obstructive findings or masses. Circumferential enhancing mass involving the mid sigmoid colon most consistent with patient's primary neoplasm. This measures approximately 5 x 5 cm. There are adjacent lymph nodes in the sigmoid mesocolon likely metastatic disease. Vascular/Lymphatic: No retroperitoneal lymphadenopathy. There is periportal and celiac axis adenopathy. Index node on image number 66 of series 2 measures 2 x 2 cm. Reproductive: The prostate gland and seminal vesicles are unremarkable. Other: Small amount of free pelvic fluid noted. I do not see any obvious omental caking or peritoneal surface lesions. No inguinal mass or adenopathy. Musculoskeletal: There is a large destructive lesion involving the right iliac bone with an associated necrotic appearing soft tissue mass extending into the extraperitoneal pelvis and into the gluteal muscles. IMPRESSION: 1. Widespread metastatic disease involving the lungs, liver, adrenal glands and abdominal lymph nodes. There is also a single large destructive metastatic bone lesion involving the right iliac bone. The primary neoplasm appears to be  a 5 cm mid sigmoid colon cancer. No obstruction at this point but the lesion is circumferential and fairly constricting appearing. 2. The right iliac bone lesion may be the easiest and safest to biopsy. Interventional radiology consultation suggested. GI and GI oncology consults also suggested Electronically Signed   By: Marijo Sanes M.D.   On: 02/04/2019 12:36   Ct Abdomen Pelvis W Contrast  Result Date: 02/04/2019 CLINICAL DATA:  Liver lesions seen on recent ultrasound examination. EXAM: CT CHEST, ABDOMEN, AND PELVIS WITH CONTRAST TECHNIQUE: Multidetector CT imaging of the chest, abdomen and pelvis was performed following the standard protocol during bolus administration of intravenous contrast. CONTRAST:  159mL OMNIPAQUE IOHEXOL 300 MG/ML  SOLN COMPARISON:  Abdominal ultrasound 02/04/2019 FINDINGS: CT CHEST FINDINGS Cardiovascular: The heart is normal in size. No pericardial effusion. The aorta is normal in caliber. No dissection. The branch vessels are patent. No atherosclerotic calcifications. The pulmonary arteries appear normal. Mediastinum/Nodes: Borderline enlarged subcarinal lymph nodes suspicious for metastatic disease. The largest node measures 14.5 mm on image number 27. The esophagus is grossly normal. Lungs/Pleura: Numerous pulmonary metastatic nodules are noted in both lungs. 18.5 mm right lower lobe nodule on image number 69. 16.5 mm left lower lobe nodule on image number 72. 6.5 mm left upper lobe nodule on image number 36. 7.5 mm right upper lobe nodule on image number 41 Small right pleural effusion, likely malignant. Musculoskeletal: No significant bony findings. No lytic or blastic bone lesions involving the chest. No supraclavicular or axillary adenopathy.  No chest wall mass. CT ABDOMEN PELVIS FINDINGS Hepatobiliary: Innumerable large irregular masses throughout both lobes of the liver. The largest lesion occupies a good portion of the right lobe and measures 14.5 x 13 cm on image  number 58 of series 2. Caudate lobe lesion measures 8.3 x 6.5 cm on image number 58 The largest left lobe lesion measures 10.5 x 8.2 cm on image number 47. The portal and hepatic veins are patent but the vessels are significantly compressed. There is  severe compression of the intrahepatic IVC. It is almost completely obliterated. The gallbladder is grossly normal.  No common bile duct dilatation. Pancreas: No mass, inflammation or ductal dilatation. Spleen: Normal size.  Indeterminate 18 mm lesion. Adrenals/Urinary Tract: Bilateral adrenal gland metastasis. The right lesion measures 5.5 x 2.7 cm and the left lesion measures 6.6 x 6.5 cm. Both kidneys are unremarkable. The bladder appears normal. Stomach/Bowel: The stomach is displaced laterally by the enlarged liver. No gastric lesions are identified. The duodenum and small bowel are unremarkable. No obstructive findings or masses. Circumferential enhancing mass involving the mid sigmoid colon most consistent with patient's primary neoplasm. This measures approximately 5 x 5 cm. There are adjacent lymph nodes in the sigmoid mesocolon likely metastatic disease. Vascular/Lymphatic: No retroperitoneal lymphadenopathy. There is periportal and celiac axis adenopathy. Index node on image number 66 of series 2 measures 2 x 2 cm. Reproductive: The prostate gland and seminal vesicles are unremarkable. Other: Small amount of free pelvic fluid noted. I do not see any obvious omental caking or peritoneal surface lesions. No inguinal mass or adenopathy. Musculoskeletal: There is a large destructive lesion involving the right iliac bone with an associated necrotic appearing soft tissue mass extending into the extraperitoneal pelvis and into the gluteal muscles. IMPRESSION: 1. Widespread metastatic disease involving the lungs, liver, adrenal glands and abdominal lymph nodes. There is also a single large destructive metastatic bone lesion involving the right iliac bone. The primary  neoplasm appears to be a 5 cm mid sigmoid colon cancer. No obstruction at this point but the lesion is circumferential and fairly constricting appearing. 2. The right iliac bone lesion may be the easiest and safest to biopsy. Interventional radiology consultation suggested. GI and GI oncology consults also suggested Electronically Signed   By: Marijo Sanes M.D.   On: 02/04/2019 12:36   Ct L-spine No Charge  Result Date: 02/04/2019 CLINICAL DATA:  Low back pain EXAM: CT LUMBAR SPINE WITHOUT CONTRAST TECHNIQUE: Multidetector CT imaging of the lumbar spine was performed without intravenous contrast administration. Multiplanar CT image reconstructions were also generated. COMPARISON:  None. FINDINGS: Segmentation: 5 lumbar type vertebrae. Alignment: Normal. Vertebrae: Vertebral body heights are maintained. No lumbar spine fracture. Abnormal 5.5 x 3 cm lytic bone lesion in the right posterior ilium with a pathologic fracture and a 8.3 x 8.2 cm soft tissue mass around the lytic bone lesion. Soft tissue mass extends posteriorly which is in close proximity to the right sciatic nerve. 6 mm nonspecific lucent lesion in the L2 vertebral body which may reflect a hemangioma, but a aggressive bone lesion cannot be excluded given the right iliac bone lesion. Paraspinal and other soft tissues: Negative. Disc levels: Disc spaces are maintained. No foraminal or central canal stenosis. IMPRESSION: 1. Abnormal 5.5 x 3 cm lytic bone lesion in the right posterior ilium with a pathologic fracture and a 8.3 x 8.2 cm soft tissue mass around the lytic bone lesion. Soft tissue mass extends posteriorly which is in close proximity to the right sciatic nerve. Findings are most consistent with metastatic disease. 2. 6 mm nonspecific lucent lesion in the L2 vertebral body which may reflect a hemangioma, but a aggressive bone lesion cannot be excluded given the right iliac bone lesion. Electronically Signed   By: Kathreen Devoid   On: 02/04/2019  12:24   Dg Abdomen Acute W/chest  Result Date: 02/04/2019 CLINICAL DATA:  Abdominal pain.  Cough. EXAM: DG ABDOMEN ACUTE W/ 1V CHEST COMPARISON:  No prior. FINDINGS: Chest  x-ray reveals bilateral hilar fullness suggesting adenopathy. Pulmonary nodules are noted bilaterally. Granulomatous disease could present in this fashion however these findings are worrisome for metastatic disease. Contrast-enhanced chest CT suggested to further evaluate. Bibasilar atelectasis/infiltrates noted. Thoracic spine scoliosis. Upright abdomen view is rotated. Liver appears prominent. No bowel distention. Stool noted throughout the colon. No free air. Tiny sclerotic density noted the left acetabulum, most likely bone island. Tiny pelvic calcification most likely phleboliths. IMPRESSION: 1. Bilateral hilar fullness suggesting adenopathy. Pulmonary nodules are noted bilaterally. Granulomatous disease could present in this fashion however these findings are worrisome for metastatic disease. Contrast-enhanced chest CT suggested to further evaluate. 2.  Bibasilar atelectasis/infiltrates. 3.  Liver appears prominent.  No bowel distention. Electronically Signed   By: Herriman   On: 02/04/2019 10:39   US Abdomen Limited Ruq  Result Date: 02/04/2019 CLINICAL DATA:  Low back pain EXAM: ULTRASOUND ABDOMEN LIMITED RIGHT UPPER QUADRANT COMPARISON:  None available FINDINGS: Gallbladder: Normal wall thickness measuring 2.1 mm. No Murphy's sign or pericholecystic fluid. Gallbladder sludge and tiny subcentimeter gallstones noted measuring 3 mm. Common bile duct: Diameter: 2.1 mm Liver: Very heterogeneous liver with mixed echogenicity hepatic lesions throughout, large lesion in the right lobe measures 15 x 14 x 12 cm. No intrahepatic biliary dilatation. Portal vein is patent on color Doppler imaging with normal direction of blood flow towards the liver. Other: Small amount of surrounding ascites noted. IMPRESSION: Heterogeneous liver  with multiple mixed echogenicity hepatic lesions measuring up to 15 cm compatible with underlying liver metastases. Trace perihepatic ascites Cholelithiasis and gallbladder sludge Electronically Signed   By: Jerilynn Mages.  Shick M.D.   On: 02/04/2019 11:43   Assessment and Plan:  1.  Widely metastatic cancer, ?  Sigmoid colon primary 2.  Hypercalcemia 3.  Elevated LFTs and total bilirubin 4.  AKI 5.  Hyponatremia 6.  Hypokalemia  -Imaging findings were discussed with the patient.  We discussed that his scans are highly suspicious for metastatic cancer.  Suspect that the mass in the sigmoid colon may be the primary cancer site.  Discussed that we will need to proceed with a biopsy to confirm the diagnosis.  Once diagnosis is confirmed, we can discuss treatment options which would consist of systemic chemotherapy.  Additionally, he would likely benefit from radiation to his lytic bone lesion in the right posterior ilium.  An order has already been placed by the hospitalist for interventional radiology to evaluate for possible liver biopsy.  Tumor markers have been requested. -Hypercalcemia related to consumption of Tums and underlying malignancy.  Recommend IV hydration.  Repeat CMET in the morning.  May need to consider pamidronate if calcium remains elevated. -Elevated LFTs and total bilirubin related to liver metastases.  Monitor closely. -AKI likely related dehydration and intake of ibuprofen.  IV fluids per hospitalist.  Avoid nephrotoxic medications.  Monitor renal function closely. -Hyponatremia is mild and likely due to vomiting and dehydration.  IV fluids per hospitalist. -Replete potassium per hospitalist.  Thank you for this referral.   Mikey Bussing, DNP, AGPCNP-BC, AOCNP  Attending Note  I personally saw the patient, reviewed the chart and examined the patient. The plan of care was discussed with the patient and the admitting team. I agree with the assessment and plan as documented above.  Thank you very much for the consultation. 1.  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  If appearance of extensive liver metastases, lung, adrenal and abdominal nodes and a large destructive right iliac bone lesion.  I discussed with the patient that this represents metastatic disease and that the goals of treatment are purely palliation.  Chemotherapy will be necessary to prolong his life but this cancer will take his life.  His mother was in the room and asked multiple questions about what would happen if he did not receive chemotherapy.  I informed her that he would have less than 6 months to live and if that is a decision then hospice will be beneficial. Patient has not made this decision yet.  2.  I discussed CODE STATUS and he wants to be DNR CC and not be put on mechanical ventilation or cardiac support.  3.  Power of attorney for healthcare decision is his mother.  He does not have a spouse.  He has a son.  I informed the patient that if he does not want to go through chemotherapy then biopsy also may not be necessary.  He will think about this and make a decision.  4.  Palliative radiation to the bone lesion might be helpful in relieving bone pain.  If he wishes to pursue that, please consult radiation oncology.

## 2019-02-04 NOTE — ED Provider Notes (Signed)
[facility] DEPT Provider Note   CSN: [id number] Arrival date & time: 02/04/19  7619    History   Chief Complaint Chief Complaint  Patient presents with   Back Pain   Emesis    HPI Joel Maxwell is a 53 y.o. male with no significant past medical history who presents for evaluation of nausea and emesis.  Patient states he has had persistent nausea and emesis over the last 4 months.  Seen previously for this and referred outpatient to GI however he has not followed up.  Patient states over the last week he has not been able to keep down much food.  He is having approximately 1-2 episodes of nonbloody, nonbilious assessment daily.  She states he is also had a nonproductive cough over the last 2 months.  States he has had chronic back pain over the last 6 months which is currently being treated by a chiropractor.  Patient states he has never had MRI previously.  Denies IV drug use, bowel or bladder incontinence, saddle paresthesia, history of drug use or malignancy.  Patient states pain worse with movement.  Pain radiates down his posterior right buttocks into his posterior leg.  Patient states his pain has been improving since he saw his chiropractor.  He has not been taking anything for his pain.  He denies any headaches, dizziness, lightheadedness, unilateral weakness, chest pain, shortness of breath, abdominal pain, diarrhea, dysuria.  Patient states he has had some generalized fatigue over the last 2 weeks.  He has no known Covid exposures.  Denies additional aggravating or alleviating factors. He admits to change in bowel movement stating that he had decreased bowel movements over the last 4 months which he attributes to lack of oral intake.  He denies change in consistency, melena or hematochezia. Patient states unintentional weight loss of 80 pounds over the last 6-8 months.  History obtained from patient past medical records.  No interpreter is used.      HPI  Past Medical History:  Diagnosis Date   Hypertension    Sciatica     Patient Active Problem List   Diagnosis Date Noted   Metastatic neoplastic disease ([place]) 02/04/2019    History reviewed. No pertinent surgical history.      Home Medications    Prior to Admission medications   Medication Sig Start Date End Date Taking? Authorizing Provider  calcium carbonate (TUMS - DOSED IN MG ELEMENTAL CALCIUM) 500 MG chewable tablet Chew 1 tablet by mouth 3 (three) times daily as needed for indigestion or heartburn.   Yes [provider]  ibuprofen (ADVIL) 200 MG tablet Take 200 mg by mouth every 6 (six) hours as needed for fever, headache or moderate pain.   Yes [provider]  ondansetron (ZOFRAN ODT) 4 MG disintegrating tablet Take 1 tablet (4 mg total) by mouth every 8 (eight) hours as needed for up to 15 doses for nausea or vomiting. Patient not taking: Reported on 02/04/2019 12/18/18   Wyvonnia Dusky, MD    Family History History reviewed. No pertinent family history.  Social History Social History   Tobacco Use   Smoking status: Never Smoker   Smokeless tobacco: Never Used  Substance Use Topics   Alcohol use: Not on file   Drug use: Not on file     Allergies   Patient has no known allergies.   Review of Systems Review of Systems  Constitutional: Positive for appetite change and fatigue. Negative for activity  change, chills, diaphoresis and fever.  HENT: Negative.   Eyes: Negative.   Respiratory: Negative.   Cardiovascular: Negative.   Gastrointestinal: Positive for nausea and vomiting. Negative for abdominal distention, abdominal pain, anal bleeding, blood in stool, constipation, diarrhea and rectal pain.  Genitourinary: Negative.   Musculoskeletal: Positive for back pain (Chronic) and gait problem. Negative for arthralgias.  Skin: Negative.   Neurological: Positive for weakness (Generalized). Negative for dizziness, tremors,  seizures, syncope, facial asymmetry, speech difficulty, light-headedness, numbness and headaches.  All other systems reviewed and are negative.   Physical Exam Updated Vital Signs BP 111/78    Pulse 93    Temp 97.6 F (36.4 C) (Oral)    Resp 16    SpO2 97%   Physical Exam Vitals signs and nursing note reviewed.  Constitutional:      General: He is not in acute distress.    Appearance: He is not ill-appearing, toxic-appearing or diaphoretic.  HENT:     Head: Normocephalic and atraumatic.     Jaw: There is normal jaw occlusion.     Nose:     Comments: No rhinorrhea and congestion to bilateral nares.  No sinus tenderness.    Mouth/Throat:     Mouth: Mucous membranes are dry.     Comments: Posterior oropharynx clear.  Mucous membranes dry.   Uvula midline without deviation.  No drooling, dysphasia or trismus.  Phonation normal. Eyes:     Extraocular Movements: Extraocular movements intact.  Neck:     Musculoskeletal: Full passive range of motion without pain and normal range of motion.     Trachea: Trachea and phonation normal.     Comments: No Neck stiffness or neck rigidity.  No cervical lymphadenopathy. Cardiovascular:     Pulses: Normal pulses.     Heart sounds: Normal heart sounds.     Comments: No murmurs rubs or gallops. Pulmonary:     Effort: Pulmonary effort is normal.     Breath sounds: Normal breath sounds and air entry.     Comments: Clear to auscultation bilaterally without wheeze, rhonchi or rales.  No accessory muscle usage.  Able speak in full sentences. Abdominal:     General: Bowel sounds are normal.     Palpations: Abdomen is soft.     Tenderness: There is no abdominal tenderness. There is no right CVA tenderness, left CVA tenderness, guarding or rebound. Negative signs include Murphy's sign.     Hernia: No hernia is present.     Comments: Soft, nontender without rebound or guarding.  No CVA tenderness. Liver 6 cm below the costophrenic margin.    Musculoskeletal:     Right hip: Normal.     Left hip: Normal.     Lumbar back: He exhibits decreased range of motion and tenderness. He exhibits no bony tenderness, no swelling, no edema, no deformity, no laceration, no pain, no spasm and normal pulse.       Back:     Comments: Moves all 4 extremities without difficulty.  Lower extremities without edema, erythema or warmth. Full range of motion of the T-spine and L-spine with flexion, hyperextension, and lateral flexion. No midline tenderness or stepoffs. No tenderness to palpation of the spinous processes of the T-spine or L-spine. Mild tenderness to palpation of the paraspinous muscles of the L-spine. Positive straight leg raise on right. Tenderness over right SI joint and piriformis muscle. No overlying skin changes, no vesicles   Skin:    Comments: Brisk capillary refill.  No  rashes or lesions. Mild Palor.  Neurological:     General: No focal deficit present.     Mental Status: He is alert.     Cranial Nerves: Cranial nerves are intact.     Sensory: Sensation is intact.     Motor: Motor function is intact.     Coordination: Coordination is intact.     Gait: Gait is intact.     Deep Tendon Reflexes:     Reflex Scores:      Patellar reflexes are 2+ on the right side and 2+ on the left side.    Comments: Speech is clear and goal oriented, follows commands Normal 5/5 strength in upper and lower extremities bilaterally including dorsiflexion and plantar flexion, strong and equal grip strength Sensation normal to light and sharp touch Moves extremities without ataxia, coordination intact Normal gait Normal balance No Clonus    ED Treatments / Results  Labs (all labs ordered are listed, but only abnormal results are displayed) Labs Reviewed  CBC WITH DIFFERENTIAL/PLATELET - Abnormal; Notable for the following components:      Result Value   WBC 18.3 (*)    Hemoglobin 12.8 (*)    RDW 18.6 (*)    Neutro Abs 15.2 (*)     Monocytes Absolute 1.2 (*)    Abs Immature Granulocytes 0.20 (*)    All other components within normal limits  COMPREHENSIVE METABOLIC PANEL - Abnormal; Notable for the following components:   Sodium 129 (*)    Potassium 3.3 (*)    Chloride 94 (*)    CO2 20 (*)    Glucose, Bld 103 (*)    BUN 35 (*)    Creatinine, Ser 1.70 (*)    Calcium 10.5 (*)    Albumin 2.5 (*)    AST 124 (*)    Alkaline Phosphatase 1,807 (*)    Total Bilirubin 2.3 (*)    GFR calc non Af Amer 45 (*)    GFR calc Af Amer 52 (*)    All other components within normal limits  SARS CORONAVIRUS 2 BY RT PCR (HOSPITAL ORDER, [facility])  LIPASE, BLOOD  URINALYSIS, ROUTINE W REFLEX MICROSCOPIC    EKG None  Radiology Ct Chest W Contrast  Result Date: 02/04/2019 CLINICAL DATA:  Liver lesions seen on recent ultrasound examination. EXAM: CT CHEST, ABDOMEN, AND PELVIS WITH CONTRAST TECHNIQUE: Multidetector CT imaging of the chest, abdomen and pelvis was performed following the standard protocol during bolus administration of intravenous contrast. CONTRAST:  120m OMNIPAQUE IOHEXOL 300 MG/ML  SOLN COMPARISON:  Abdominal ultrasound 02/04/2019 FINDINGS: CT CHEST FINDINGS Cardiovascular: The heart is normal in size. No pericardial effusion. The aorta is normal in caliber. No dissection. The branch vessels are patent. No atherosclerotic calcifications. The pulmonary arteries appear normal. Mediastinum/Nodes: Borderline enlarged subcarinal lymph nodes suspicious for metastatic disease. The largest node measures 14.5 mm on image number 27. The esophagus is grossly normal. Lungs/Pleura: Numerous pulmonary metastatic nodules are noted in both lungs. 18.5 mm right lower lobe nodule on image number 69. 16.5 mm left lower lobe nodule on image number 72. 6.5 mm left upper lobe nodule on image number 36. 7.5 mm right upper lobe nodule on image number 41 Small right pleural effusion, likely malignant. Musculoskeletal:  No significant bony findings. No lytic or blastic bone lesions involving the chest. No supraclavicular or axillary adenopathy.  No chest wall mass. CT ABDOMEN PELVIS FINDINGS Hepatobiliary: Innumerable large irregular masses throughout both lobes of  the liver. The largest lesion occupies a good portion of the right lobe and measures 14.5 x 13 cm on image number 58 of series 2. Caudate lobe lesion measures 8.3 x 6.5 cm on image number 58 The largest left lobe lesion measures 10.5 x 8.2 cm on image number 47. The portal and hepatic veins are patent but the vessels are significantly compressed. There is severe compression of the intrahepatic IVC. It is almost completely obliterated. The gallbladder is grossly normal.  No common bile duct dilatation. Pancreas: No mass, inflammation or ductal dilatation. Spleen: Normal size.  Indeterminate 18 mm lesion. Adrenals/Urinary Tract: Bilateral adrenal gland metastasis. The right lesion measures 5.5 x 2.7 cm and the left lesion measures 6.6 x 6.5 cm. Both kidneys are unremarkable. The bladder appears normal. Stomach/Bowel: The stomach is displaced laterally by the enlarged liver. No gastric lesions are identified. The duodenum and small bowel are unremarkable. No obstructive findings or masses. Circumferential enhancing mass involving the mid sigmoid colon most consistent with patient's primary neoplasm. This measures approximately 5 x 5 cm. There are adjacent lymph nodes in the sigmoid mesocolon likely metastatic disease. Vascular/Lymphatic: No retroperitoneal lymphadenopathy. There is periportal and celiac axis adenopathy. Index node on image number 66 of series 2 measures 2 x 2 cm. Reproductive: The prostate gland and seminal vesicles are unremarkable. Other: Small amount of free pelvic fluid noted. I do not see any obvious omental caking or peritoneal surface lesions. No inguinal mass or adenopathy. Musculoskeletal: There is a large destructive lesion involving the right  iliac bone with an associated necrotic appearing soft tissue mass extending into the extraperitoneal pelvis and into the gluteal muscles. IMPRESSION: 1. Widespread metastatic disease involving the lungs, liver, adrenal glands and abdominal lymph nodes. There is also a single large destructive metastatic bone lesion involving the right iliac bone. 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 No obstruction at this point but the lesion is circumferential and fairly constricting appearing. 2. The right iliac bone lesion may be the easiest and safest to biopsy. Interventional radiology consultation suggested. GI and GI oncology consults also suggested Electronically Signed   By: Marijo Sanes M.D.   On: 02/04/2019 12:36   Ct Abdomen Pelvis W Contrast  Result Date: 02/04/2019 CLINICAL DATA:  Liver lesions seen on recent ultrasound examination. EXAM: CT CHEST, ABDOMEN, AND PELVIS WITH CONTRAST TECHNIQUE: Multidetector CT imaging of the chest, abdomen and pelvis was performed following the standard protocol during bolus administration of intravenous contrast. CONTRAST:  110m OMNIPAQUE IOHEXOL 300 MG/ML  SOLN COMPARISON:  Abdominal ultrasound 02/04/2019 FINDINGS: CT CHEST FINDINGS Cardiovascular: The heart is normal in size. No pericardial effusion. The aorta is normal in caliber. No dissection. The branch vessels are patent. No atherosclerotic calcifications. The pulmonary arteries appear normal. Mediastinum/Nodes: Borderline enlarged subcarinal lymph nodes suspicious for metastatic disease. The largest node measures 14.5 mm on image number 27. The esophagus is grossly normal. Lungs/Pleura: Numerous pulmonary metastatic nodules are noted in both lungs. 18.5 mm right lower lobe nodule on image number 69. 16.5 mm left lower lobe nodule on image number 72. 6.5 mm left upper lobe nodule on image number 36. 7.5 mm right upper lobe nodule on image number 41 Small right pleural effusion, likely  malignant. Musculoskeletal: No significant bony findings. No lytic or blastic bone lesions involving the chest. No supraclavicular or axillary adenopathy.  No chest wall mass. CT ABDOMEN PELVIS FINDINGS Hepatobiliary: Innumerable large irregular masses throughout both lobes of  the liver. The largest lesion occupies a good portion of the right lobe and measures 14.5 x 13 cm on image number 58 of series 2. Caudate lobe lesion measures 8.3 x 6.5 cm on image number 58 The largest left lobe lesion measures 10.5 x 8.2 cm on image number 47. The portal and hepatic veins are patent but the vessels are significantly compressed. There is severe compression of the intrahepatic IVC. It is almost completely obliterated. The gallbladder is grossly normal.  No common bile duct dilatation. Pancreas: No mass, inflammation or ductal dilatation. Spleen: Normal size.  Indeterminate 18 mm lesion. Adrenals/Urinary Tract: Bilateral adrenal gland metastasis. The right lesion measures 5.5 x 2.7 cm and the left lesion measures 6.6 x 6.5 cm. Both kidneys are unremarkable. The bladder appears normal. Stomach/Bowel: The stomach is displaced laterally by the enlarged liver. No gastric lesions are identified. The duodenum and small bowel are unremarkable. No obstructive findings or masses. Circumferential enhancing mass involving the mid sigmoid colon most consistent with patient's primary neoplasm. This measures approximately 5 x 5 cm. There are adjacent lymph nodes in the sigmoid mesocolon likely metastatic disease. Vascular/Lymphatic: No retroperitoneal lymphadenopathy. There is periportal and celiac axis adenopathy. Index node on image number 66 of series 2 measures 2 x 2 cm. Reproductive: The prostate gland and seminal vesicles are unremarkable. Other: Small amount of free pelvic fluid noted. I do not see any obvious omental caking or peritoneal surface lesions. No inguinal mass or adenopathy. Musculoskeletal: There is a large destructive  lesion involving the right iliac bone with an associated necrotic appearing soft tissue mass extending into the extraperitoneal pelvis and into the gluteal muscles. IMPRESSION: 1. Widespread metastatic disease involving the lungs, liver, adrenal glands and abdominal lymph nodes. There is also a single large destructive metastatic bone lesion involving the right iliac bone. 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 No obstruction at this point but the lesion is circumferential and fairly constricting appearing. 2. The right iliac bone lesion may be the easiest and safest to biopsy. Interventional radiology consultation suggested. GI and GI oncology consults also suggested Electronically Signed   By: Marijo Sanes M.D.   On: 02/04/2019 12:36   Ct L-spine No Charge  Result Date: 02/04/2019 CLINICAL DATA:  Low back pain EXAM: CT LUMBAR SPINE WITHOUT CONTRAST TECHNIQUE: Multidetector CT imaging of the lumbar spine was performed without intravenous contrast administration. Multiplanar CT image reconstructions were also generated. COMPARISON:  None. FINDINGS: Segmentation: 5 lumbar type vertebrae. Alignment: Normal. Vertebrae: Vertebral body heights are maintained. No lumbar spine fracture. Abnormal 5.5 x 3 cm lytic bone lesion in the right posterior ilium with a pathologic fracture and a 8.3 x 8.2 cm soft tissue mass around the lytic bone lesion. Soft tissue mass extends posteriorly which is in close proximity to the right sciatic nerve. 6 mm nonspecific lucent lesion in the L2 vertebral body which may reflect a hemangioma, but a aggressive bone lesion cannot be excluded given the right iliac bone lesion. Paraspinal and other soft tissues: Negative. Disc levels: Disc spaces are maintained. No foraminal or central canal stenosis. IMPRESSION: 1. Abnormal 5.5 x 3 cm lytic bone lesion in the right posterior ilium with a pathologic fracture and a 8.3 x 8.2 cm soft tissue mass around the lytic  bone lesion. Soft tissue mass extends posteriorly which is in close proximity to the right sciatic nerve. Findings are most consistent with metastatic disease. 2. 6 mm nonspecific lucent lesion  in the L2 vertebral body which may reflect a hemangioma, but a aggressive bone lesion cannot be excluded given the right iliac bone lesion. Electronically Signed   By: Kathreen Devoid   On: 02/04/2019 12:24   Dg Abdomen Acute W/chest  Result Date: 02/04/2019 CLINICAL DATA:  Abdominal pain.  Cough. EXAM: DG ABDOMEN ACUTE W/ 1V CHEST COMPARISON:  No prior. FINDINGS: Chest x-ray reveals bilateral hilar fullness suggesting adenopathy. Pulmonary nodules are noted bilaterally. Granulomatous disease could present in this fashion however these findings are worrisome for metastatic disease. Contrast-enhanced chest CT suggested to further evaluate. Bibasilar atelectasis/infiltrates noted. Thoracic spine scoliosis. Upright abdomen view is rotated. Liver appears prominent. No bowel distention. Stool noted throughout the colon. No free air. Tiny sclerotic density noted the left acetabulum, most likely bone island. Tiny pelvic calcification most likely phleboliths. IMPRESSION: 1. Bilateral hilar fullness suggesting adenopathy. Pulmonary nodules are noted bilaterally. Granulomatous disease could present in this fashion however these findings are worrisome for metastatic disease. Contrast-enhanced chest CT suggested to further evaluate. 2.  Bibasilar atelectasis/infiltrates. 3.  Liver appears prominent.  No bowel distention. Electronically Signed   By: [place]   On: 02/04/2019 10:39   US Abdomen Limited Ruq  Result Date: 02/04/2019 CLINICAL DATA:  Low back pain EXAM: ULTRASOUND ABDOMEN LIMITED RIGHT UPPER QUADRANT COMPARISON:  None available FINDINGS: Gallbladder: Normal wall thickness measuring 2.1 mm. No Murphy's sign or pericholecystic fluid. Gallbladder sludge and tiny subcentimeter gallstones noted measuring 3 mm. Common  bile duct: Diameter: 2.1 mm Liver: Very heterogeneous liver with mixed echogenicity hepatic lesions throughout, large lesion in the right lobe measures 15 x 14 x 12 cm. No intrahepatic biliary dilatation. Portal vein is patent on color Doppler imaging with normal direction of blood flow towards the liver. Other: Small amount of surrounding ascites noted. IMPRESSION: Heterogeneous liver with multiple mixed echogenicity hepatic lesions measuring up to 15 cm compatible with underlying liver metastases. Trace perihepatic ascites Cholelithiasis and gallbladder sludge Electronically Signed   By: Jerilynn Mages.  Shick M.D.   On: 02/04/2019 11:43    Procedures Procedures (including critical care time)  Medications Ordered in ED Medications  sodium chloride (PF) 0.9 % injection (has no administration in time range)  sodium chloride 0.9 % bolus 1,000 mL (0 mLs Intravenous Stopped 02/04/19 1037)  metoCLOPramide (REGLAN) injection 5 mg (5 mg Intravenous Given 02/04/19 0949)  sodium chloride 0.9 % bolus 1,000 mL (0 mLs Intravenous Stopped 02/04/19 1428)  iohexol (OMNIPAQUE) 300 MG/ML solution 100 mL (100 mLs Intravenous Contrast Given 02/04/19 1137)   Initial Impression / Assessment and Plan / ED Course  I have reviewed the triage vital signs and the nursing notes.  Pertinent labs & imaging results that were available during my care of the patient were reviewed by me and considered in my medical decision making (see chart for details).  53 year old male appears chronically ill presents for evaluation of chronic back pain, persistent nausea and emesis as well as 80 pound weight loss.  He is afebrile, nonseptic appearing. Enlarged liver on exam otherwise non tender abdomen. Heart ans lungs clear. Plan for labs and imagine. He has had a cough however no COVID exposures.  Labs personally reviewed.  CBC with leukocytosis, metabolic panel with multiple electrolyte, renal, liver abnormalities, significant with significantly  elevated alk phos, bilirubin. RU with possible met liver mets.  Unfortunately patient CT scan shows diffuse metastatic disease with likely lesion a 5 cm sigmoid colon cancer. There are mets to his iliac wing, liver,  adrenals, lungs.  Discussed results with patient.  Consulted with Dr. Lindi Adie with oncology.  Recommends hospitalist admission for cancer work-up.  He will see patient.  Consulted with Dr. Lisabeth Devoid with [facility] who will evaluate patient for admission. VS stable.  Patient seen and evaluated by attending Dr. Jeanell Sparrow who agrees with above treatment, plan and disposition.  Patient has asked that I discuss results of labs and imaging with Mother, Michel Hendon. Discussed results with Mother Cameran Pettey at [phone]. She is planning on visiting patient later today. She appreciated the update on patient status. Discussed with patient conversation had with his Mother and he is thankful for letting her know the results.   RANELL SKIBINSKI was evaluated in Emergency Department on 02/04/2019 for the symptoms described in the history of present illness. He was evaluated in the context of the global COVID-19 pandemic, which necessitated consideration that the patient might be at risk for infection with the SARS-CoV-2 virus that causes COVID-19. Institutional protocols and algorithms that pertain to the evaluation of patients at risk for COVID-19 are in a state of rapid change based on information released by regulatory bodies including the CDC and federal and state organizations. These policies and algorithms were followed during the patient's care in the ED.     Final Clinical Impressions(s) / ED Diagnoses   Final diagnoses:  Low back pain  Metastatic malignant neoplasm, unspecified site (HCC)  Elevated LFTs  AKI (acute kidney injury) ([place])  Chronic right-sided low back pain with right-sided sciatica  Hyponatremia  Non-intractable vomiting with nausea, unspecified vomiting type    ED Discharge Orders      None       Kellen Hover A, PA-C 02/04/19 1452    Pattricia Boss, MD 02/04/19 1511

## 2019-02-04 NOTE — H&P (Addendum)
TRH H&P    Patient Demographics:    Banyan Tracht, is a 53 y.o. male  MRN: [id number]  DOB - January 18, 1966  Admit Date - 02/04/2019  Referring MD/NP/PA: Gari Crown handily  Outpatient Primary MD for the patient is Patient, No Pcp Per  Patient coming from: Home  Chief complaint-vomiting, back pain   HPI:    Kayvin Moody  is a 53 y.o. male, with no significant medical problems, came to ED with complaints of nausea, vomiting and back pain.  Patient said that symptoms have been going on for past 4 months.  He was seen in the ED previously for the symptoms and was referred for outpatient GI however he did not follow-up.  Patient says that over the past 1 week he has not been able to eat anything down.  Usually has 1-2 episodes of nonbloody emesis.  He has lost 80 pounds weight over the last 6 to 8 months.  Also patient has been having chronic back pain for which he saw chiropractor. He denies chest pain or shortness of breath. Denies fever or chills. Denies dysuria. No previous history of stroke or seizures.  In the ED initial CT of the lumbar spine showed 5.5 time 3 cm lytic lesion in the right posterior ilium with pathologic fracture and 8.3 x 8.2 cm soft tissue mass around the lytic bone lesion.  CT abdomen pelvis and chest was done which showed widely metastatic disease, with mets to liver, lungs, adrenals, abdominal lymph nodes.  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    Review of systems:    In addition to the HPI above,    All other systems reviewed and are negative.    Past History of the following :    Past Medical History:  Diagnosis Date   Hypertension    Sciatica       History reviewed. No pertinent surgical history.    Social History:      Social History   Tobacco Use   Smoking status: Never Smoker   Smokeless tobacco: Never Used  Substance Use Topics   Alcohol  use: Not on file       Family History :  No family history of cancer   Home Medications:   Prior to Admission medications   Medication Sig Start Date End Date Taking? Authorizing Provider  calcium carbonate (TUMS - DOSED IN MG ELEMENTAL CALCIUM) 500 MG chewable tablet Chew 1 tablet by mouth 3 (three) times daily as needed for indigestion or heartburn.   Yes [provider]  ibuprofen (ADVIL) 200 MG tablet Take 200 mg by mouth every 6 (six) hours as needed for fever, headache or moderate pain.   Yes [provider]  ondansetron (ZOFRAN ODT) 4 MG disintegrating tablet Take 1 tablet (4 mg total) by mouth every 8 (eight) hours as needed for up to 15 doses for nausea or vomiting. Patient not taking: Reported on 02/04/2019 12/18/18   Wyvonnia Dusky, MD     Allergies:    No Known Allergies  Physical Exam:   Vitals  Blood pressure 116/88, pulse 100, temperature 97.6 F (36.4 C), temperature source Oral, resp. rate 16, SpO2 96 %.  1.  General: Appears in no acute distress  2. Psychiatric: Alert, oriented x3, intact insight and judgment  3. Neurologic: Cranial nerves II through XII grossly intact, no focal deficit noted, moving all extremities  4. HEENMT:  Atraumatic normocephalic, extraocular muscles are intact  5. Respiratory : Clear to auscultation bilaterally, no wheezing or crackles  6. Cardiovascular : S1-S2, regular, no murmur auscultated  7. Gastrointestinal:  Abdomen is soft, nontender, mild distention, marked hepatomegaly noted on palpation      Data Review:    CBC Recent Labs  Lab 02/04/19 0946  WBC 18.3*  HGB 12.8*  HCT 40.9  PLT 325  MCV 85.0  MCH 26.6  MCHC 31.3  RDW 18.6*  LYMPHSABS 1.5  MONOABS 1.2*  EOSABS 0.2  BASOSABS 0.1   ------------------------------------------------------------------------------------------------------------------  Results for orders placed or performed during the hospital encounter of  02/04/19 (from the past 48 hour(s))  CBC with Differential     Status: Abnormal   Collection Time: 02/04/19  9:46 AM  Result Value Ref Range   WBC 18.3 (H) 4.0 - 10.5 K/uL   RBC 4.81 4.22 - 5.81 MIL/uL   Hemoglobin 12.8 (L) 13.0 - 17.0 g/dL   HCT 40.9 39.0 - 52.0 %   MCV 85.0 80.0 - 100.0 fL   MCH 26.6 26.0 - 34.0 pg   MCHC 31.3 30.0 - 36.0 g/dL   RDW 18.6 (H) 11.5 - 15.5 %   Platelets 325 150 - 400 K/uL   nRBC 0.0 0.0 - 0.2 %   Neutrophils Relative % 84 %   Neutro Abs 15.2 (H) 1.7 - 7.7 K/uL   Lymphocytes Relative 8 %   Lymphs Abs 1.5 0.7 - 4.0 K/uL   Monocytes Relative 6 %   Monocytes Absolute 1.2 (H) 0.1 - 1.0 K/uL   Eosinophils Relative 1 %   Eosinophils Absolute 0.2 0.0 - 0.5 K/uL   Basophils Relative 0 %   Basophils Absolute 0.1 0.0 - 0.1 K/uL   Immature Granulocytes 1 %   Abs Immature Granulocytes 0.20 (H) 0.00 - 0.07 K/uL    Comment: Performed at [facility], [place] [address] [place], [place] [address]  Comprehensive metabolic panel     Status: Abnormal   Collection Time: 02/04/19  9:46 AM  Result Value Ref Range   Sodium 129 (L) 135 - 145 mmol/L   Potassium 3.3 (L) 3.5 - 5.1 mmol/L   Chloride 94 (L) 98 - 111 mmol/L   CO2 20 (L) 22 - 32 mmol/L   Glucose, Bld 103 (H) 70 - 99 mg/dL   BUN 35 (H) 6 - 20 mg/dL   Creatinine, Ser 1.70 (H) 0.61 - 1.24 mg/dL   Calcium 10.5 (H) 8.9 - 10.3 mg/dL   Total Protein 6.8 6.5 - 8.1 g/dL   Albumin 2.5 (L) 3.5 - 5.0 g/dL   AST 124 (H) 15 - 41 U/L   ALT 31 0 - 44 U/L   Alkaline Phosphatase 1,807 (H) 38 - 126 U/L    Comment: RESULTS CONFIRMED BY MANUAL DILUTION   Total Bilirubin 2.3 (H) 0.3 - 1.2 mg/dL   GFR calc non Af Amer 45 (L) >60 mL/min   GFR calc Af Amer 52 (L) >60 mL/min   Anion gap 15 5 - 15    Comment: Performed at [facility],  [place] [address] [place], [place] [address]  Lipase, blood     Status: None   Collection Time: 02/04/19  9:46 AM  Result Value Ref Range   Lipase 18 11 - 51  U/L    Comment: Performed at [facility], [place] [address] [place], [place] [address]  SARS Coronavirus 2 by RT PCR (hospital order, performed in [facility] hospital lab) Nasopharyngeal Nasopharyngeal Swab     Status: None   Collection Time: 02/04/19  1:05 PM   Specimen: Nasopharyngeal Swab  Result Value Ref Range   SARS Coronavirus 2 NEGATIVE NEGATIVE    Comment: (NOTE) If result is NEGATIVE SARS-CoV-2 target nucleic acids are NOT DETECTED. The SARS-CoV-2 RNA is generally detectable in upper and lower  respiratory specimens during the acute phase of infection. The lowest  concentration of SARS-CoV-2 viral copies this assay can detect is 250  copies / mL. A negative result does not preclude SARS-CoV-2 infection  and should not be used as the sole basis for treatment or other  patient management decisions.  A negative result may occur with  improper specimen collection / handling, submission of specimen other  than nasopharyngeal swab, presence of viral mutation(s) within the  areas targeted by this assay, and inadequate number of viral copies  (<250 copies / mL). A negative result must be combined with clinical  observations, patient history, and epidemiological information. If result is POSITIVE SARS-CoV-2 target nucleic acids are DETECTED. The SARS-CoV-2 RNA is generally detectable in upper and lower  respiratory specimens dur ing the acute phase of infection.  Positive  results are indicative of active infection with SARS-CoV-2.  Clinical  correlation with patient history and other diagnostic information is  necessary to determine patient infection status.  Positive results do  not rule out bacterial infection or co-infection with other viruses. If result is PRESUMPTIVE POSTIVE SARS-CoV-2 nucleic acids MAY BE PRESENT.   A presumptive positive result was obtained on the submitted specimen  and confirmed on repeat testing.  While 2019 novel coronavirus    (SARS-CoV-2) nucleic acids may be present in the submitted sample  additional confirmatory testing may be necessary for epidemiological  and / or clinical management purposes  to differentiate between  SARS-CoV-2 and other Sarbecovirus currently known to infect humans.  If clinically indicated additional testing with an alternate test  methodology [phone]) is advised. The SARS-CoV-2 RNA is generally  detectable in upper and lower respiratory sp ecimens during the acute  phase of infection. The expected result is Negative. Fact Sheet for Patients:  StrictlyIdeas.no Fact Sheet for Healthcare Providers: BankingDealers.co.za This test is not yet approved or cleared by the [place] FDA and has been authorized for detection and/or diagnosis of SARS-CoV-2 by FDA under an Emergency Use Authorization (EUA).  This EUA will remain in effect (meaning this test can be used) for the duration of the COVID-19 declaration under Section 564(b)(1) of the Act, 21 U.S.C. section 360bbb-3(b)(1), unless the authorization is terminated or revoked sooner. Performed at [facility], [place] [address] [place], [place] [address]     Chemistries  Recent Labs  Lab 02/04/19 [phone]  NA 129*  K 3.3*  CL 94*  CO2 20*  GLUCOSE 103*  BUN 35*  CREATININE 1.70*  CALCIUM 10.5*  AST 124*  ALT 31  ALKPHOS 1,807*  BILITOT 2.3*   ------------------------------------------------------------------------------------------------------------------  ------------------------------------------------------------------------------------------------------------------ GFR: CrCl cannot be calculated (Unknown ideal weight.). Liver Function Tests: Recent Labs  Lab 02/04/19 [phone]  AST 124*  ALT 31  ALKPHOS 1,807*  BILITOT 2.3*  PROT 6.8  ALBUMIN 2.5*   Recent Labs  Lab 02/04/19 0946  LIPASE 18     --------------------------------------------------------------------------------------------------------------- Urine analysis: No results found for: COLORURINE, APPEARANCEUR, LABSPEC, PHURINE, GLUCOSEU, HGBUR, BILIRUBINUR, KETONESUR, PROTEINUR, UROBILINOGEN, NITRITE, LEUKOCYTESUR    Imaging Results:    Ct Chest W Contrast  Result Date: 02/04/2019 CLINICAL DATA:  Liver lesions seen on recent ultrasound examination. EXAM: CT CHEST, ABDOMEN, AND PELVIS WITH CONTRAST TECHNIQUE: Multidetector CT imaging of the chest, abdomen and pelvis was performed following the standard protocol during bolus administration of intravenous contrast. CONTRAST:  132mL OMNIPAQUE IOHEXOL 300 MG/ML  SOLN COMPARISON:  Abdominal ultrasound 02/04/2019 FINDINGS: CT CHEST FINDINGS Cardiovascular: The heart is normal in size. No pericardial effusion. The aorta is normal in caliber. No dissection. The branch vessels are patent. No atherosclerotic calcifications. The pulmonary arteries appear normal. Mediastinum/Nodes: Borderline enlarged subcarinal lymph nodes suspicious for metastatic disease. The largest node measures 14.5 mm on image number 27. The esophagus is grossly normal. Lungs/Pleura: Numerous pulmonary metastatic nodules are noted in both lungs. 18.5 mm right lower lobe nodule on image number 69. 16.5 mm left lower lobe nodule on image number 72. 6.5 mm left upper lobe nodule on image number 36. 7.5 mm right upper lobe nodule on image number 41 Small right pleural effusion, likely malignant. Musculoskeletal: No significant bony findings. No lytic or blastic bone lesions involving the chest. No supraclavicular or axillary adenopathy.  No chest wall mass. CT ABDOMEN PELVIS FINDINGS Hepatobiliary: Innumerable large irregular masses throughout both lobes of the liver. The largest lesion occupies a good portion of the right lobe and measures 14.5 x 13 cm on image number 58 of series 2. Caudate lobe lesion measures 8.3 x 6.5 cm  on image number 58 The largest left lobe lesion measures 10.5 x 8.2 cm on image number 47. The portal and hepatic veins are patent but the vessels are significantly compressed. There is severe compression of the intrahepatic IVC. It is almost completely obliterated. The gallbladder is grossly normal.  No common bile duct dilatation. Pancreas: No mass, inflammation or ductal dilatation. Spleen: Normal size.  Indeterminate 18 mm lesion. Adrenals/Urinary Tract: Bilateral adrenal gland metastasis. The right lesion measures 5.5 x 2.7 cm and the left lesion measures 6.6 x 6.5 cm. Both kidneys are unremarkable. The bladder appears normal. Stomach/Bowel: The stomach is displaced laterally by the enlarged liver. No gastric lesions are identified. The duodenum and small bowel are unremarkable. No obstructive findings or masses. Circumferential enhancing mass involving the mid sigmoid colon most consistent with patient's primary neoplasm. This measures approximately 5 x 5 cm. There are adjacent lymph nodes in the sigmoid mesocolon likely metastatic disease. Vascular/Lymphatic: No retroperitoneal lymphadenopathy. There is periportal and celiac axis adenopathy. Index node on image number 66 of series 2 measures 2 x 2 cm. Reproductive: The prostate gland and seminal vesicles are unremarkable. Other: Small amount of free pelvic fluid noted. I do not see any obvious omental caking or peritoneal surface lesions. No inguinal mass or adenopathy. Musculoskeletal: There is a large destructive lesion involving the right iliac bone with an associated necrotic appearing soft tissue mass extending into the extraperitoneal pelvis and into the gluteal muscles. IMPRESSION: 1. Widespread metastatic disease involving the lungs, liver, adrenal glands and abdominal lymph nodes. There is also a single large destructive metastatic bone lesion involving the right iliac bone. The primary neoplasm appears to be a 5 cm mid sigmoid  colon cancer. No  obstruction at this point but the lesion is circumferential and fairly constricting appearing. 2. The right iliac bone lesion may be the easiest and safest to biopsy. Interventional radiology consultation suggested. GI and GI oncology consults also suggested Electronically Signed   By: Marijo Sanes M.D.   On: 02/04/2019 12:36   Ct Abdomen Pelvis W Contrast  Result Date: 02/04/2019 CLINICAL DATA:  Liver lesions seen on recent ultrasound examination. EXAM: CT CHEST, ABDOMEN, AND PELVIS WITH CONTRAST TECHNIQUE: Multidetector CT imaging of the chest, abdomen and pelvis was performed following the standard protocol during bolus administration of intravenous contrast. CONTRAST:  171mL OMNIPAQUE IOHEXOL 300 MG/ML  SOLN COMPARISON:  Abdominal ultrasound 02/04/2019 FINDINGS: CT CHEST FINDINGS Cardiovascular: The heart is normal in size. No pericardial effusion. The aorta is normal in caliber. No dissection. The branch vessels are patent. No atherosclerotic calcifications. The pulmonary arteries appear normal. Mediastinum/Nodes: Borderline enlarged subcarinal lymph nodes suspicious for metastatic disease. The largest node measures 14.5 mm on image number 27. The esophagus is grossly normal. Lungs/Pleura: Numerous pulmonary metastatic nodules are noted in both lungs. 18.5 mm right lower lobe nodule on image number 69. 16.5 mm left lower lobe nodule on image number 72. 6.5 mm left upper lobe nodule on image number 36. 7.5 mm right upper lobe nodule on image number 41 Small right pleural effusion, likely malignant. Musculoskeletal: No significant bony findings. No lytic or blastic bone lesions involving the chest. No supraclavicular or axillary adenopathy.  No chest wall mass. CT ABDOMEN PELVIS FINDINGS Hepatobiliary: Innumerable large irregular masses throughout both lobes of the liver. The largest lesion occupies a good portion of the right lobe and measures 14.5 x 13 cm on image number 58 of series 2. Caudate lobe  lesion measures 8.3 x 6.5 cm on image number 58 The largest left lobe lesion measures 10.5 x 8.2 cm on image number 47. The portal and hepatic veins are patent but the vessels are significantly compressed. There is severe compression of the intrahepatic IVC. It is almost completely obliterated. The gallbladder is grossly normal.  No common bile duct dilatation. Pancreas: No mass, inflammation or ductal dilatation. Spleen: Normal size.  Indeterminate 18 mm lesion. Adrenals/Urinary Tract: Bilateral adrenal gland metastasis. The right lesion measures 5.5 x 2.7 cm and the left lesion measures 6.6 x 6.5 cm. Both kidneys are unremarkable. The bladder appears normal. Stomach/Bowel: The stomach is displaced laterally by the enlarged liver. No gastric lesions are identified. The duodenum and small bowel are unremarkable. No obstructive findings or masses. Circumferential enhancing mass involving the mid sigmoid colon most consistent with patient's primary neoplasm. This measures approximately 5 x 5 cm. There are adjacent lymph nodes in the sigmoid mesocolon likely metastatic disease. Vascular/Lymphatic: No retroperitoneal lymphadenopathy. There is periportal and celiac axis adenopathy. Index node on image number 66 of series 2 measures 2 x 2 cm. Reproductive: The prostate gland and seminal vesicles are unremarkable. Other: Small amount of free pelvic fluid noted. I do not see any obvious omental caking or peritoneal surface lesions. No inguinal mass or adenopathy. Musculoskeletal: There is a large destructive lesion involving the right iliac bone with an associated necrotic appearing soft tissue mass extending into the extraperitoneal pelvis and into the gluteal muscles. IMPRESSION: 1. Widespread metastatic disease involving the lungs, liver, adrenal glands and abdominal lymph nodes. There is also a single large destructive metastatic bone lesion involving the right iliac bone. The primary neoplasm appears to be a 5 cm mid  sigmoid colon cancer. No obstruction at this point but the lesion is circumferential and fairly constricting appearing. 2. The right iliac bone lesion may be the easiest and safest to biopsy. Interventional radiology consultation suggested. GI and GI oncology consults also suggested Electronically Signed   By: Marijo Sanes M.D.   On: 02/04/2019 12:36   Ct L-spine No Charge  Result Date: 02/04/2019 CLINICAL DATA:  Low back pain EXAM: CT LUMBAR SPINE WITHOUT CONTRAST TECHNIQUE: Multidetector CT imaging of the lumbar spine was performed without intravenous contrast administration. Multiplanar CT image reconstructions were also generated. COMPARISON:  None. FINDINGS: Segmentation: 5 lumbar type vertebrae. Alignment: Normal. Vertebrae: Vertebral body heights are maintained. No lumbar spine fracture. Abnormal 5.5 x 3 cm lytic bone lesion in the right posterior ilium with a pathologic fracture and a 8.3 x 8.2 cm soft tissue mass around the lytic bone lesion. Soft tissue mass extends posteriorly which is in close proximity to the right sciatic nerve. 6 mm nonspecific lucent lesion in the L2 vertebral body which may reflect a hemangioma, but a aggressive bone lesion cannot be excluded given the right iliac bone lesion. Paraspinal and other soft tissues: Negative. Disc levels: Disc spaces are maintained. No foraminal or central canal stenosis. IMPRESSION: 1. Abnormal 5.5 x 3 cm lytic bone lesion in the right posterior ilium with a pathologic fracture and a 8.3 x 8.2 cm soft tissue mass around the lytic bone lesion. Soft tissue mass extends posteriorly which is in close proximity to the right sciatic nerve. Findings are most consistent with metastatic disease. 2. 6 mm nonspecific lucent lesion in the L2 vertebral body which may reflect a hemangioma, but a aggressive bone lesion cannot be excluded given the right iliac bone lesion. Electronically Signed   By: Kathreen Devoid   On: 02/04/2019 12:24   Dg Abdomen Acute  W/chest  Result Date: 02/04/2019 CLINICAL DATA:  Abdominal pain.  Cough. EXAM: DG ABDOMEN ACUTE W/ 1V CHEST COMPARISON:  No prior. FINDINGS: Chest x-ray reveals bilateral hilar fullness suggesting adenopathy. Pulmonary nodules are noted bilaterally. Granulomatous disease could present in this fashion however these findings are worrisome for metastatic disease. Contrast-enhanced chest CT suggested to further evaluate. Bibasilar atelectasis/infiltrates noted. Thoracic spine scoliosis. Upright abdomen view is rotated. Liver appears prominent. No bowel distention. Stool noted throughout the colon. No free air. Tiny sclerotic density noted the left acetabulum, most likely bone island. Tiny pelvic calcification most likely phleboliths. IMPRESSION: 1. Bilateral hilar fullness suggesting adenopathy. Pulmonary nodules are noted bilaterally. Granulomatous disease could present in this fashion however these findings are worrisome for metastatic disease. Contrast-enhanced chest CT suggested to further evaluate. 2.  Bibasilar atelectasis/infiltrates. 3.  Liver appears prominent.  No bowel distention. Electronically Signed   By: [place]   On: 02/04/2019 10:39   US Abdomen Limited Ruq  Result Date: 02/04/2019 CLINICAL DATA:  Low back pain EXAM: ULTRASOUND ABDOMEN LIMITED RIGHT UPPER QUADRANT COMPARISON:  None available FINDINGS: Gallbladder: Normal wall thickness measuring 2.1 mm. No Murphy's sign or pericholecystic fluid. Gallbladder sludge and tiny subcentimeter gallstones noted measuring 3 mm. Common bile duct: Diameter: 2.1 mm Liver: Very heterogeneous liver with mixed echogenicity hepatic lesions throughout, large lesion in the right lobe measures 15 x 14 x 12 cm. No intrahepatic biliary dilatation. Portal vein is patent on color Doppler imaging with normal direction of blood flow towards the liver. Other: Small amount of surrounding ascites noted. IMPRESSION: Heterogeneous liver with multiple mixed  echogenicity hepatic lesions measuring up to 15  cm compatible with underlying liver metastases. Trace perihepatic ascites Cholelithiasis and gallbladder sludge Electronically Signed   By: Jerilynn Mages.  Shick M.D.   On: 02/04/2019 11:43       Assessment & Plan:    Active Problems:   Metastatic neoplastic disease ([place])   1. Metastatic neoplastic disease-primary likely from sigmoid colon as seen on the CT abdomen pelvis.  Patient has large lesions in the liver.  Will obtain IR for biopsy from liver mass.  Oncology has been consulted.  We will also consult gastroenterology.  2. Intractable nausea and vomiting-secondary to above, started on IV normal saline.  Zofran as needed for nausea and vomiting.  3. Hypokalemia-potassium was 3.3, will replace potassium.  Follow BMP in a.m.  4. Hyponatremia-mild, sodium 129.  Likely from vomiting and dehydration.  Started on IV normal saline.  Follow BMP in a.m.  5. Leukocytosis-WBC is 18.3, does not appear to be septic.  Likely reactive from above.  6. Acute kidney injury-patient's creatinine is 1.70, last creatinine from September 2020 was 1.26.  Likely from dehydration from poor p.o. intake and vomiting.  Started on IV normal saline.  Follow BMP in a.m.    DVT Prophylaxis-   SCDs  AM Labs Ordered, also please review Full Orders  Family Communication: Admission, patients condition and plan of care including tests being ordered have been discussed with the patient  who indicate understanding and agree with the plan and Code Status.  Code Status: Full code  Admission status: Inpatient: Based on patients clinical presentation and evaluation of above clinical data, I have made determination that patient meets Inpatient criteria at this time.  Time spent in minutes : 60 minutes   Oswald Hillock M.D on 02/04/2019 at 2:24 PM

## 2019-02-04 NOTE — ED Notes (Signed)
[place])

## 2019-02-05 ENCOUNTER — Other Ambulatory Visit: Payer: Self-pay

## 2019-02-05 DIAGNOSIS — M549 Dorsalgia, unspecified: Secondary | ICD-10-CM

## 2019-02-05 DIAGNOSIS — C799 Secondary malignant neoplasm of unspecified site: Secondary | ICD-10-CM | POA: Diagnosis present

## 2019-02-05 DIAGNOSIS — M5440 Lumbago with sciatica, unspecified side: Secondary | ICD-10-CM

## 2019-02-05 DIAGNOSIS — N179 Acute kidney failure, unspecified: Secondary | ICD-10-CM

## 2019-02-05 LAB — COMPREHENSIVE METABOLIC PANEL
ALT: 28 U/L (ref 0–44)
AST: 103 U/L — ABNORMAL HIGH (ref 15–41)
Albumin: 2.2 g/dL — ABNORMAL LOW (ref 3.5–5.0)
Alkaline Phosphatase: 1686 U/L — ABNORMAL HIGH (ref 38–126)
Anion gap: 12 (ref 5–15)
BUN: 29 mg/dL — ABNORMAL HIGH (ref 6–20)
CO2: 19 mmol/L — ABNORMAL LOW (ref 22–32)
Calcium: 9.7 mg/dL (ref 8.9–10.3)
Chloride: 99 mmol/L (ref 98–111)
Creatinine, Ser: 1.42 mg/dL — ABNORMAL HIGH (ref 0.61–1.24)
GFR calc Af Amer: >60 mL/min (ref >60)
GFR calc non Af Amer: 56 mL/min — ABNORMAL LOW (ref >60)
Glucose, Bld: 89 mg/dL (ref 70–99)
Potassium: 3.8 mmol/L (ref 3.5–5.1)
Sodium: 130 mmol/L — ABNORMAL LOW (ref 135–145)
Total Bilirubin: 2.2 mg/dL — ABNORMAL HIGH (ref 0.3–1.2)
Total Protein: 5.9 g/dL — ABNORMAL LOW (ref 6.5–8.1)

## 2019-02-05 LAB — CBC
HCT: 39.6 % (ref 39.0–52.0)
Hemoglobin: 12.1 g/dL — ABNORMAL LOW (ref 13.0–17.0)
MCH: 26.6 pg (ref 26.0–34.0)
MCHC: 30.6 g/dL (ref 30.0–36.0)
MCV: 87 fL (ref 80.0–100.0)
Platelets: 297 10*3/uL (ref 150–400)
RBC: 4.55 MIL/uL (ref 4.22–5.81)
RDW: 18.8 % — ABNORMAL HIGH (ref 11.5–15.5)
WBC: 18.9 10*3/uL — ABNORMAL HIGH (ref 4.0–10.5)
nRBC: 0 % (ref 0.0–0.2)

## 2019-02-05 LAB — HIV ANTIBODY (ROUTINE TESTING W REFLEX): HIV Screen 4th Generation wRfx: NONREACTIVE

## 2019-02-05 MED ORDER — ONDANSETRON 4 MG PO TBDP: 4.0000 mg | ORAL_TABLET | Freq: Three times a day (TID) | ORAL | 0 refills | Status: AC | PRN

## 2019-02-05 MED ORDER — OXYCODONE HCL 5 MG PO TABS
5.0000 mg | ORAL_TABLET | Freq: Four times a day (QID) | ORAL | Status: DC | PRN
  Administered 2019-02-05: 5 mg via ORAL
  Filled 2019-02-05: qty 1

## 2019-02-05 MED ORDER — CHLORHEXIDINE GLUCONATE 0.12 % MT SOLN
15.0000 mL | Freq: Two times a day (BID) | OROMUCOSAL | Status: DC
  Administered 2019-02-05: 09:00:00 15 mL via OROMUCOSAL
  Filled 2019-02-05: qty 15

## 2019-02-05 MED ORDER — HYDROMORPHONE HCL 1 MG/ML IJ SOLN: 0.5000 mg | INTRAMUSCULAR | Status: DC | PRN

## 2019-02-05 MED ORDER — OXYCODONE HCL 5 MG PO TABS: 5.0000 mg | ORAL_TABLET | Freq: Four times a day (QID) | ORAL | 0 refills | Status: AC | PRN

## 2019-02-05 MED ORDER — ACETAMINOPHEN 325 MG PO TABS: 650.0000 mg | ORAL_TABLET | Freq: Four times a day (QID) | ORAL | Status: AC | PRN

## 2019-02-05 MED ORDER — ORAL CARE MOUTH RINSE: 15.0000 mL | Freq: Two times a day (BID) | OROMUCOSAL | Status: DC

## 2019-02-05 MED ORDER — POLYETHYLENE GLYCOL 3350 17 G PO PACK: 17.0000 g | PACK | Freq: Every day | ORAL | Status: DC | PRN

## 2019-02-05 NOTE — Progress Notes (Signed)
Pt stable at this time. Pt awaiting his ride to return and also waiting on social work to return with paper work as well. No needs at this time. No signs of distress. Hospice has already been in contact with pt.

## 2019-02-05 NOTE — Progress Notes (Signed)
Dr.Goodrich notified of hospice set up for the patient. rn also discussed need for other medication for nausea for discharge. md to address this concern.

## 2019-02-05 NOTE — Progress Notes (Signed)
PROGRESS NOTE  Joel Maxwell [id number] DOB: 03-31-1966 DOA: 02/04/2019 PCP: Nicholas Lose, MD  Brief History   53 year old man presented for evaluation of chronic back pain, nausea, vomiting, 80 pound weight loss.  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  A & P  Presumed widely metastatic cancer, possible sigmoid colon primary.   --Oncology recommended biopsy with consideration for palliative chemotherapy and radiation.  Interventional radiology was consulted. --However this a.m. the patient does not wish to pursue biopsy or have any further consideration for treatment.  No chemotherapy.  He wishes to go home with hospice today if possible.  He will be living with his mother.  Intractable nausea and vomiting secondary to above --Supportive care  Chronic back pain secondary to malignancy.  Imaging showed lytic bone lesion right posterior ilium with pathologic fracture and soft tissue mass --Plan narcotics for treatment.  Discussed bowel regimen with patient.  Hyponatremia --Improved with hydration.  AKI, secondary vomiting and possibly ibuprofen --Improving with IV fluids  . Patient clear in his goals of care and understanding of his care.  He declines any further evaluation or treatment and wishes discharge home with hospice.  DVT prophylaxis: not indicated  Code Status: DNR Family Communication: none Disposition Plan: home with hospice    Murray Hodgkins, MD  [facility] Direct contact: see [email or web address] (further directions at bottom of note if needed) 7PM-7AM contact night coverage as at bottom of note 02/05/2019, 11:20 AM  LOS: 1 day   Significant Hospital Events   . 10/30 admitted for vomiting, new diagnosis metastatic cancer   Consults:  . Oncology . Interventional radiology   Procedures:  .   Significant Diagnostic Tests:  . SARS Covid negative . CT chest abdomen pelvis: Widespread metastatic disease involving  lungs, liver, adrenal glands and abdominal lymph nodes, large destructive metastatic bone lesion right iliac bone.  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  No obstruction noted.   Micro Data:  .    Antimicrobials:  .   Interval History/Subjective  Pains okay right now but when he gets worse he gets up to a 9/10.  Pains in his right side and back.  Objective   Vitals:  Vitals:   02/04/19 2023 02/05/19 0552  BP: 121/87 128/89  Pulse: (!) 103 (!) 110  Resp: 16 18  Temp: 97.8 F (36.6 C) 98.1 F (36.7 C)  SpO2: 99% 96%    Exam: Constitutional: Appears calm, comfortable Cardiovascular: Regular rate and rhythm.  No murmur, rub or gallop.  No lower extremity edema. Respiratory: Clear to auscultation bilaterally.  No wheezes, rales or rhonchi.  Normal respiratory effort. Abdomen soft Psychiatric grossly normal mood and affect.  Speech fluent and appropriate.   I have personally reviewed the following:   Today's Data  . Sodium 130, creatinine improved 1.42 . Noted LFTs noted including alkaline phosphatase of 1686, total bilirubin 0.2 . WBC stable at 18.9  Scheduled Meds: . chlorhexidine  15 mL Mouth Rinse BID  . mouth rinse  15 mL Mouth Rinse q12n4p   Continuous Infusions: . sodium chloride 75 mL/hr at 02/05/19 [id number]    Principal Problem:   Metastatic neoplastic disease ([place]) Active Problems:   AKI (acute kidney injury) ([place])   Back pain   LOS: 1 day   How to contact the [facility] Attending or Consulting provider [place] or covering provider during after hours [place], for this patient?  1. Check the  care team in [facility] and look for a) attending/consulting [place] provider listed and b) the [facility] team listed 2. Log into [email or web address] and use [place]'s universal password to access. If you do not have the password, please contact the hospital operator. 3. Locate the [facility] provider you are looking for under [facility] and page to a number that you can be  directly reached. 4. If you still have difficulty reaching the provider, please page the [facility] (Director on Call) for the Hospitalists listed on amion for assistance.

## 2019-02-05 NOTE — TOC Initial Note (Signed)
Transition of Care [facility]) - Initial/Assessment Note    Patient Details  Name: Joel Maxwell MRN: [id number] Date of Birth: [birth year]  Transition of Care (TOC) CM/SW Contact:    Joaquin Courts, RN Phone Number: 02/05/2019, 12:29 PM  Clinical Narrative:                 Cm spoke with patient and offered hospice choice. [facility]. Referral given to Yetta Glassman.  Expected Discharge Plan: Home w Hospice Care Barriers to Discharge: No Barriers Identified   Patient Goals and CMS Choice Patient states their goals for this hospitalization and ongoing recovery are:: to go home with hospice today CMS Medicare.gov Compare Post Acute Care list provided to:: Patient Choice offered to / list presented to : Patient  Expected Discharge Plan and Services Expected Discharge Plan: [place]   Discharge Planning Services: CM Consult Post Acute Care Choice: Hospice Living arrangements for the past 2 months: Apartment Expected Discharge Date: 02/05/19               DME Arranged: N/A DME Agency: NA       HH Arranged: NA HH Agency: NA        Prior Living Arrangements/Services Living arrangements for the past 2 months: Apartment Lives with:: Self Patient language and need for interpreter reviewed:: Yes Do you feel safe going back to the place where you live?: Yes      Need for Family Participation in Patient Care: Yes (Comment) Care giver support system in place?: Yes (comment)   Criminal Activity/Legal Involvement Pertinent to Current Situation/Hospitalization: No - Comment as needed  Activities of Daily Living Home Assistive Devices/Equipment: None ADL Screening (condition at time of admission) Patient's cognitive ability adequate to safely complete daily activities?: Yes Is the patient deaf or have difficulty hearing?: No Does the patient have difficulty seeing, even when wearing glasses/contacts?: No Does the patient have difficulty  concentrating, remembering, or making decisions?: No Patient able to express need for assistance with ADLs?: No Does the patient have difficulty dressing or bathing?: No Independently performs ADLs?: Yes (appropriate for developmental age) Does the patient have difficulty walking or climbing stairs?: Yes Weakness of Legs: Both Weakness of Arms/Hands: None  Permission Sought/Granted   Permission granted to share information with : Yes, Verbal Permission Granted     Permission granted to share info w AGENCY: [facility]        Emotional Assessment Appearance:: Appears stated age Attitude/Demeanor/Rapport: Engaged Affect (typically observed): Accepting Orientation: : Oriented to Place, Oriented to  Time, Oriented to Situation, Oriented to Self   Psych Involvement: No (comment)  Admission diagnosis:  Low back pain [M54.5] Hyponatremia [E87.1] Elevated LFTs [R79.89] Metastatic disease (HCC) [C79.9] AKI (acute kidney injury) ([place]) [N17.9] Chronic right-sided low back pain with right-sided sciatica [M54.41, G89.29] Non-intractable vomiting with nausea, unspecified vomiting type [R11.2] Metastatic malignant neoplasm, unspecified site [facility]) [C79.9] Patient Active Problem List   Diagnosis Date Noted  . AKI (acute kidney injury) ([place]) 02/05/2019  . Back pain 02/05/2019  . Metastatic cancer ([place]) 02/05/2019  . Metastatic neoplastic disease ([place]) 02/04/2019   PCP:  Nicholas Lose, MD Pharmacy:   [facility] [address], [place] [address] Phone: [phone] Fax: [phone]     Social Determinants of Health (SDOH) Interventions    Readmission Risk Interventions No flowsheet data found.

## 2019-02-05 NOTE — Discharge Summary (Signed)
Physician Discharge Summary  Joel Maxwell [id number] DOB: Mar 05, 1966 DOA: 02/04/2019  PCP: Nicholas Lose, MD  Admit date: 02/04/2019 Discharge date: 02/05/2019  Recommendations for Outpatient Follow-up:  1. Home with hospice care.  Patient declines any further evaluation or treatment.     Discharge Diagnoses: Principal diagnosis is #1 1. 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   2. Intractable nausea and vomiting secondary to above 3. Chronic back pain secondary to malignancy 4. Hyponatremia secondary to malignancy 5. AKI, secondary vomiting and possibly ibuprofen  Discharge Condition: stable Disposition: home with hospice  Diet recommendation: as desired  Filed Weights   02/04/19 2024  Weight: 82.9 kg    History of present illness:  53 year old man presented for evaluation of chronic back pain, nausea, vomiting, 80 pound weight loss.  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  Hospital Course:  Patient was treated, treated with supportive care, seen by oncology, consideration was given to biopsy, palliative radiation and chemotherapy, however he was noted he had metastatic cancer and treatment would be palliative only.  After further consideration, the patient declines to pursue biopsy, chemotherapy or radiation requests discharge home with hospice.  He will be going to live with his mother.  I discussed the case with the patient's oncologist this morning Dr. Lindi Adie who concurs with discharge home with hospice and no further treatment.  He is kindly agreed to serve as the patient's attending physician.  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   --Oncology recommended biopsy with consideration for palliative chemotherapy and radiation.  Interventional radiology was consulted. --However this a.m. the patient does not wish to pursue biopsy or have any further consideration for  treatment.  No chemotherapy.  He wishes to go home with hospice today if possible.  He will be living with his mother.  Intractable nausea and vomiting secondary to above --Supportive care  Chronic back pain secondary to malignancy.  Imaging showed lytic bone lesion right posterior ilium with pathologic fracture and soft tissue mass --Plan narcotics for treatment.  Discussed bowel regimen with patient.  Hyponatremia --Improved with hydration.  AKI, secondary vomiting and possibly ibuprofen --Improving with IV fluids  Significant Hospital Events    10/30 admitted for vomiting, new diagnosis metastatic cancer  Consults:   Oncology  Interventional radiology  Procedures:     Significant Diagnostic Tests:   SARS Covid negative  CT chest abdomen pelvis: Widespread metastatic disease involving lungs, liver, adrenal glands and abdominal lymph nodes, large destructive metastatic bone lesion right iliac bone.  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  No obstruction noted.  Today's assessment: Pains okay right now but when he gets worse he gets up to a 9/10.  Pains in his right side and back.  O: Vitals:  Vitals:   02/04/19 2023 02/05/19 0552  BP: 121/87 128/89  Pulse: (!) 103 (!) 110  Resp: 16 18  Temp: 97.8 F (36.6 C) 98.1 F (36.7 C)  SpO2: 99% 96%    Constitutional: Appears calm, comfortable Cardiovascular: Regular rate and rhythm.  No murmur, rub or gallop.  No lower extremity edema. Respiratory: Clear to auscultation bilaterally.  No wheezes, rales or rhonchi.  Normal respiratory effort. Abdomen soft Psychiatric grossly normal mood and affect.  Speech fluent and appropriate.  I have personally reviewed the following:   Today's Data   Sodium 130, creatinine improved 1.42  Noted LFTs noted including alkaline phosphatase of 1686, total bilirubin  0.2  WBC stable at 18.9   Discharge Instructions   Allergies as of 02/05/2019    No Known Allergies     Medication List    STOP taking these medications   ibuprofen 200 MG tablet Commonly known as: ADVIL     TAKE these medications   acetaminophen 325 MG tablet Commonly known as: TYLENOL Take 2 tablets (650 mg total) by mouth every 6 (six) hours as needed for mild pain (or Fever >/= 101).   calcium carbonate 500 MG chewable tablet Commonly known as: TUMS - dosed in mg elemental calcium Chew 1 tablet by mouth 3 (three) times daily as needed for indigestion or heartburn.   ondansetron 4 MG disintegrating tablet Commonly known as: Zofran ODT Take 1 tablet (4 mg total) by mouth every 8 (eight) hours as needed for up to 30 doses for nausea or vomiting.   oxyCODONE 5 MG immediate release tablet Commonly known as: Oxy IR/ROXICODONE Take 1-2 tablets (5-10 mg total) by mouth every 6 (six) hours as needed for moderate pain or severe pain (1 tab for moderate, 2 for severe).      No Known Allergies  The results of significant diagnostics from this hospitalization (including imaging, microbiology, ancillary and laboratory) are listed below for reference.    Significant Diagnostic Studies: Ct Chest W Contrast  Result Date: 02/04/2019 CLINICAL DATA:  Liver lesions seen on recent ultrasound examination. EXAM: CT CHEST, ABDOMEN, AND PELVIS WITH CONTRAST TECHNIQUE: Multidetector CT imaging of the chest, abdomen and pelvis was performed following the standard protocol during bolus administration of intravenous contrast. CONTRAST:  135mL OMNIPAQUE IOHEXOL 300 MG/ML  SOLN COMPARISON:  Abdominal ultrasound 02/04/2019 FINDINGS: CT CHEST FINDINGS Cardiovascular: The heart is normal in size. No pericardial effusion. The aorta is normal in caliber. No dissection. The branch vessels are patent. No atherosclerotic calcifications. The pulmonary arteries appear normal. Mediastinum/Nodes: Borderline enlarged subcarinal lymph nodes suspicious for metastatic disease. The largest node  measures 14.5 mm on image number 27. The esophagus is grossly normal. Lungs/Pleura: Numerous pulmonary metastatic nodules are noted in both lungs. 18.5 mm right lower lobe nodule on image number 69. 16.5 mm left lower lobe nodule on image number 72. 6.5 mm left upper lobe nodule on image number 36. 7.5 mm right upper lobe nodule on image number 41 Small right pleural effusion, likely malignant. Musculoskeletal: No significant bony findings. No lytic or blastic bone lesions involving the chest. No supraclavicular or axillary adenopathy.  No chest wall mass. CT ABDOMEN PELVIS FINDINGS Hepatobiliary: Innumerable large irregular masses throughout both lobes of the liver. The largest lesion occupies a good portion of the right lobe and measures 14.5 x 13 cm on image number 58 of series 2. Caudate lobe lesion measures 8.3 x 6.5 cm on image number 58 The largest left lobe lesion measures 10.5 x 8.2 cm on image number 47. The portal and hepatic veins are patent but the vessels are significantly compressed. There is severe compression of the intrahepatic IVC. It is almost completely obliterated. The gallbladder is grossly normal.  No common bile duct dilatation. Pancreas: No mass, inflammation or ductal dilatation. Spleen: Normal size.  Indeterminate 18 mm lesion. Adrenals/Urinary Tract: Bilateral adrenal gland metastasis. The right lesion measures 5.5 x 2.7 cm and the left lesion measures 6.6 x 6.5 cm. Both kidneys are unremarkable. The bladder appears normal. Stomach/Bowel: The stomach is displaced laterally by the enlarged liver. No gastric lesions are identified. The duodenum and small bowel are unremarkable. No obstructive  findings or masses. Circumferential enhancing mass involving the mid sigmoid colon most consistent with patient's primary neoplasm. This measures approximately 5 x 5 cm. There are adjacent lymph nodes in the sigmoid mesocolon likely metastatic disease. Vascular/Lymphatic: No retroperitoneal  lymphadenopathy. There is periportal and celiac axis adenopathy. Index node on image number 66 of series 2 measures 2 x 2 cm. Reproductive: The prostate gland and seminal vesicles are unremarkable. Other: Small amount of free pelvic fluid noted. I do not see any obvious omental caking or peritoneal surface lesions. No inguinal mass or adenopathy. Musculoskeletal: There is a large destructive lesion involving the right iliac bone with an associated necrotic appearing soft tissue mass extending into the extraperitoneal pelvis and into the gluteal muscles. IMPRESSION: 1. Widespread metastatic disease involving the lungs, liver, adrenal glands and abdominal lymph nodes. There is also a single large destructive metastatic bone lesion involving the right iliac bone. 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 No obstruction at this point but the lesion is circumferential and fairly constricting appearing. 2. The right iliac bone lesion may be the easiest and safest to biopsy. Interventional radiology consultation suggested. GI and GI oncology consults also suggested Electronically Signed   By: Marijo Sanes M.D.   On: 02/04/2019 12:36   Ct Abdomen Pelvis W Contrast  Result Date: 02/04/2019 CLINICAL DATA:  Liver lesions seen on recent ultrasound examination. EXAM: CT CHEST, ABDOMEN, AND PELVIS WITH CONTRAST TECHNIQUE: Multidetector CT imaging of the chest, abdomen and pelvis was performed following the standard protocol during bolus administration of intravenous contrast. CONTRAST:  152mL OMNIPAQUE IOHEXOL 300 MG/ML  SOLN COMPARISON:  Abdominal ultrasound 02/04/2019 FINDINGS: CT CHEST FINDINGS Cardiovascular: The heart is normal in size. No pericardial effusion. The aorta is normal in caliber. No dissection. The branch vessels are patent. No atherosclerotic calcifications. The pulmonary arteries appear normal. Mediastinum/Nodes: Borderline enlarged subcarinal lymph nodes suspicious for  metastatic disease. The largest node measures 14.5 mm on image number 27. The esophagus is grossly normal. Lungs/Pleura: Numerous pulmonary metastatic nodules are noted in both lungs. 18.5 mm right lower lobe nodule on image number 69. 16.5 mm left lower lobe nodule on image number 72. 6.5 mm left upper lobe nodule on image number 36. 7.5 mm right upper lobe nodule on image number 41 Small right pleural effusion, likely malignant. Musculoskeletal: No significant bony findings. No lytic or blastic bone lesions involving the chest. No supraclavicular or axillary adenopathy.  No chest wall mass. CT ABDOMEN PELVIS FINDINGS Hepatobiliary: Innumerable large irregular masses throughout both lobes of the liver. The largest lesion occupies a good portion of the right lobe and measures 14.5 x 13 cm on image number 58 of series 2. Caudate lobe lesion measures 8.3 x 6.5 cm on image number 58 The largest left lobe lesion measures 10.5 x 8.2 cm on image number 47. The portal and hepatic veins are patent but the vessels are significantly compressed. There is severe compression of the intrahepatic IVC. It is almost completely obliterated. The gallbladder is grossly normal.  No common bile duct dilatation. Pancreas: No mass, inflammation or ductal dilatation. Spleen: Normal size.  Indeterminate 18 mm lesion. Adrenals/Urinary Tract: Bilateral adrenal gland metastasis. The right lesion measures 5.5 x 2.7 cm and the left lesion measures 6.6 x 6.5 cm. Both kidneys are unremarkable. The bladder appears normal. Stomach/Bowel: The stomach is displaced laterally by the enlarged liver. No gastric lesions are identified. The duodenum and small bowel are unremarkable. No obstructive  findings or masses. Circumferential enhancing mass involving the mid sigmoid colon most consistent with patient's primary neoplasm. This measures approximately 5 x 5 cm. There are adjacent lymph nodes in the sigmoid mesocolon likely metastatic disease.  Vascular/Lymphatic: No retroperitoneal lymphadenopathy. There is periportal and celiac axis adenopathy. Index node on image number 66 of series 2 measures 2 x 2 cm. Reproductive: The prostate gland and seminal vesicles are unremarkable. Other: Small amount of free pelvic fluid noted. I do not see any obvious omental caking or peritoneal surface lesions. No inguinal mass or adenopathy. Musculoskeletal: There is a large destructive lesion involving the right iliac bone with an associated necrotic appearing soft tissue mass extending into the extraperitoneal pelvis and into the gluteal muscles. IMPRESSION: 1. Widespread metastatic disease involving the lungs, liver, adrenal glands and abdominal lymph nodes. There is also a single large destructive metastatic bone lesion involving the right iliac bone. 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 No obstruction at this point but the lesion is circumferential and fairly constricting appearing. 2. The right iliac bone lesion may be the easiest and safest to biopsy. Interventional radiology consultation suggested. GI and GI oncology consults also suggested Electronically Signed   By: Marijo Sanes M.D.   On: 02/04/2019 12:36   Ct L-spine No Charge  Result Date: 02/04/2019 CLINICAL DATA:  Low back pain EXAM: CT LUMBAR SPINE WITHOUT CONTRAST TECHNIQUE: Multidetector CT imaging of the lumbar spine was performed without intravenous contrast administration. Multiplanar CT image reconstructions were also generated. COMPARISON:  None. FINDINGS: Segmentation: 5 lumbar type vertebrae. Alignment: Normal. Vertebrae: Vertebral body heights are maintained. No lumbar spine fracture. Abnormal 5.5 x 3 cm lytic bone lesion in the right posterior ilium with a pathologic fracture and a 8.3 x 8.2 cm soft tissue mass around the lytic bone lesion. Soft tissue mass extends posteriorly which is in close proximity to the right sciatic nerve. 6 mm nonspecific lucent  lesion in the L2 vertebral body which may reflect a hemangioma, but a aggressive bone lesion cannot be excluded given the right iliac bone lesion. Paraspinal and other soft tissues: Negative. Disc levels: Disc spaces are maintained. No foraminal or central canal stenosis. IMPRESSION: 1. Abnormal 5.5 x 3 cm lytic bone lesion in the right posterior ilium with a pathologic fracture and a 8.3 x 8.2 cm soft tissue mass around the lytic bone lesion. Soft tissue mass extends posteriorly which is in close proximity to the right sciatic nerve. Findings are most consistent with metastatic disease. 2. 6 mm nonspecific lucent lesion in the L2 vertebral body which may reflect a hemangioma, but a aggressive bone lesion cannot be excluded given the right iliac bone lesion. Electronically Signed   By: Kathreen Devoid   On: 02/04/2019 12:24   Dg Abdomen Acute W/chest  Result Date: 02/04/2019 CLINICAL DATA:  Abdominal pain.  Cough. EXAM: DG ABDOMEN ACUTE W/ 1V CHEST COMPARISON:  No prior. FINDINGS: Chest x-ray reveals bilateral hilar fullness suggesting adenopathy. Pulmonary nodules are noted bilaterally. Granulomatous disease could present in this fashion however these findings are worrisome for metastatic disease. Contrast-enhanced chest CT suggested to further evaluate. Bibasilar atelectasis/infiltrates noted. Thoracic spine scoliosis. Upright abdomen view is rotated. Liver appears prominent. No bowel distention. Stool noted throughout the colon. No free air. Tiny sclerotic density noted the left acetabulum, most likely bone island. Tiny pelvic calcification most likely phleboliths. IMPRESSION: 1. Bilateral hilar fullness suggesting adenopathy. Pulmonary nodules are noted bilaterally. Granulomatous disease could present  in this fashion however these findings are worrisome for metastatic disease. Contrast-enhanced chest CT suggested to further evaluate. 2.  Bibasilar atelectasis/infiltrates. 3.  Liver appears prominent.  No bowel  distention. Electronically Signed   By: Genola   On: 02/04/2019 10:39   US Abdomen Limited Ruq  Result Date: 02/04/2019 CLINICAL DATA:  Low back pain EXAM: ULTRASOUND ABDOMEN LIMITED RIGHT UPPER QUADRANT COMPARISON:  None available FINDINGS: Gallbladder: Normal wall thickness measuring 2.1 mm. No Murphy's sign or pericholecystic fluid. Gallbladder sludge and tiny subcentimeter gallstones noted measuring 3 mm. Common bile duct: Diameter: 2.1 mm Liver: Very heterogeneous liver with mixed echogenicity hepatic lesions throughout, large lesion in the right lobe measures 15 x 14 x 12 cm. No intrahepatic biliary dilatation. Portal vein is patent on color Doppler imaging with normal direction of blood flow towards the liver. Other: Small amount of surrounding ascites noted. IMPRESSION: Heterogeneous liver with multiple mixed echogenicity hepatic lesions measuring up to 15 cm compatible with underlying liver metastases. Trace perihepatic ascites Cholelithiasis and gallbladder sludge Electronically Signed   By: Jerilynn Mages.  Shick M.D.   On: 02/04/2019 11:43    Microbiology: Recent Results (from the past 240 hour(s))  SARS Coronavirus 2 by RT PCR (hospital order, performed in [facility] hospital lab) Nasopharyngeal Nasopharyngeal Swab     Status: None   Collection Time: 02/04/19  1:05 PM   Specimen: Nasopharyngeal Swab  Result Value Ref Range Status   SARS Coronavirus 2 NEGATIVE NEGATIVE Final    Comment: (NOTE) If result is NEGATIVE SARS-CoV-2 target nucleic acids are NOT DETECTED. The SARS-CoV-2 RNA is generally detectable in upper and lower  respiratory specimens during the acute phase of infection. The lowest  concentration of SARS-CoV-2 viral copies this assay can detect is 250  copies / mL. A negative result does not preclude SARS-CoV-2 infection  and should not be used as the sole basis for treatment or other  patient management decisions.  A negative result may occur with  improper specimen  collection / handling, submission of specimen other  than nasopharyngeal swab, presence of viral mutation(s) within the  areas targeted by this assay, and inadequate number of viral copies  (<250 copies / mL). A negative result must be combined with clinical  observations, patient history, and epidemiological information. If result is POSITIVE SARS-CoV-2 target nucleic acids are DETECTED. The SARS-CoV-2 RNA is generally detectable in upper and lower  respiratory specimens dur ing the acute phase of infection.  Positive  results are indicative of active infection with SARS-CoV-2.  Clinical  correlation with patient history and other diagnostic information is  necessary to determine patient infection status.  Positive results do  not rule out bacterial infection or co-infection with other viruses. If result is PRESUMPTIVE POSTIVE SARS-CoV-2 nucleic acids MAY BE PRESENT.   A presumptive positive result was obtained on the submitted specimen  and confirmed on repeat testing.  While 2019 novel coronavirus  (SARS-CoV-2) nucleic acids may be present in the submitted sample  additional confirmatory testing may be necessary for epidemiological  and / or clinical management purposes  to differentiate between  SARS-CoV-2 and other Sarbecovirus currently known to infect humans.  If clinically indicated additional testing with an alternate test  methodology [phone]) is advised. The SARS-CoV-2 RNA is generally  detectable in upper and lower respiratory sp ecimens during the acute  phase of infection. The expected result is Negative. Fact Sheet for Patients:  StrictlyIdeas.no Fact Sheet for Healthcare Providers: BankingDealers.co.za This test is  not yet approved or cleared by the [place] and has been authorized for detection and/or diagnosis of SARS-CoV-2 by FDA under an Emergency Use Authorization (EUA).  This EUA will remain in effect  (meaning this test can be used) for the duration of the COVID-19 declaration under Section 564(b)(1) of the Act, 21 U.S.C. section 360bbb-3(b)(1), unless the authorization is terminated or revoked sooner. Performed at [facility], [place] [address] [place], [place] [address]      Labs: Basic Metabolic Panel: Recent Labs  Lab 02/04/19 0946 02/05/19 0248  NA 129* 130*  K 3.3* 3.8  CL 94* 99  CO2 20* 19*  GLUCOSE 103* 89  BUN 35* 29*  CREATININE 1.70* 1.42*  CALCIUM 10.5* 9.7   Liver Function Tests: Recent Labs  Lab 02/04/19 0946 02/05/19 0248  AST 124* 103*  ALT 31 28  ALKPHOS 1,807* 1,686*  BILITOT 2.3* 2.2*  PROT 6.8 5.9*  ALBUMIN 2.5* 2.2*   Recent Labs  Lab 02/04/19 0946  LIPASE 18   CBC: Recent Labs  Lab 02/04/19 0946 02/05/19 0248  WBC 18.3* 18.9*  NEUTROABS 15.2*  --   HGB 12.8* 12.1*  HCT 40.9 39.6  MCV 85.0 87.0  PLT 325 297    Active Problems:   Metastatic neoplastic disease ([place])   Time coordinating discharge: 45 minutes  Signed:  Murray Hodgkins, MD  [facility]  02/05/2019, 11:19 AM

## 2019-02-05 NOTE — Progress Notes (Signed)
Patient came to the unit at approximately 2014. He is alert and verbally responsive but has a flat affect. Voiced no complaints at this time.

## 2019-02-05 NOTE — Plan of Care (Signed)
  Problem: Clinical Measurements: Goal: Respiratory complications will improve Outcome: Progressing   Problem: Clinical Measurements: Goal: Cardiovascular complication will be avoided Outcome: Progressing   Problem: Activity: Goal: Risk for activity intolerance will decrease Outcome: Progressing   Problem: Nutrition: Goal: Adequate nutrition will be maintained Outcome: Progressing   

## 2019-02-06 LAB — CEA: CEA: 187 ng/mL — ABNORMAL HIGH (ref 0.0–4.7)

## 2019-02-06 LAB — AFP TUMOR MARKER: AFP, Serum, Tumor Marker: 1.1 ng/mL (ref 0.0–8.3)

## 2019-02-06 LAB — CANCER ANTIGEN 19-9: CA 19-9: 361 U/mL — ABNORMAL HIGH (ref 0–35)

## 2019-03-08 DEATH — deceased

## 2020-05-17 IMAGING — CT CT CHEST W/ CM
2 of 5 series · 12 of 36 positions shown, 15 images · IV contrast (omnipaque)
Comparison: Abdominal ultrasound 02/04/2019

CLINICAL DATA: Liver lesions seen on recent ultrasound examination.

EXAM:
CT CHEST, ABDOMEN, AND PELVIS WITH CONTRAST
TECHNIQUE: Multidetector CT imaging of the chest, abdomen and pelvis was
performed following the standard protocol during bolus
administration of intravenous contrast.
CONTRAST:  100mL OMNIPAQUE IOHEXOL 300 MG/ML  SOLN

[Series 2: cap with · axial · 0.79mm/px · z∈[-696,-110]mm · 9 of 145 slices shown, 12 images]
[im 14/145  mediastinal]
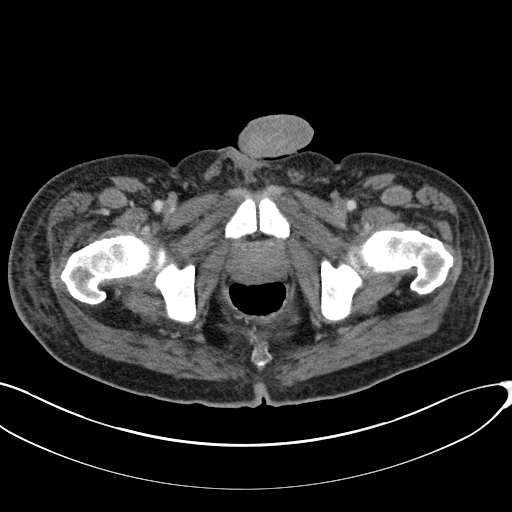
[im 14/145  lung]
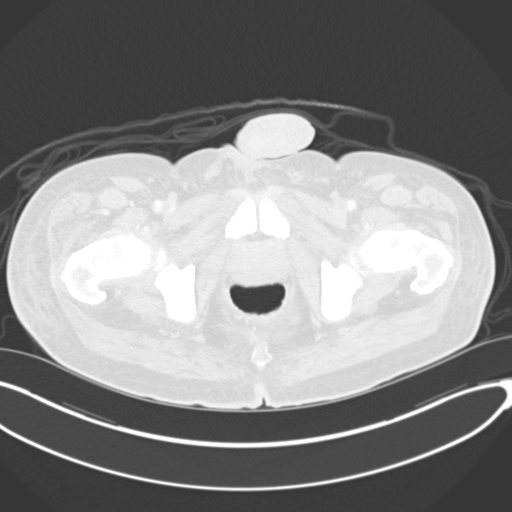
[im 27/145  lung]
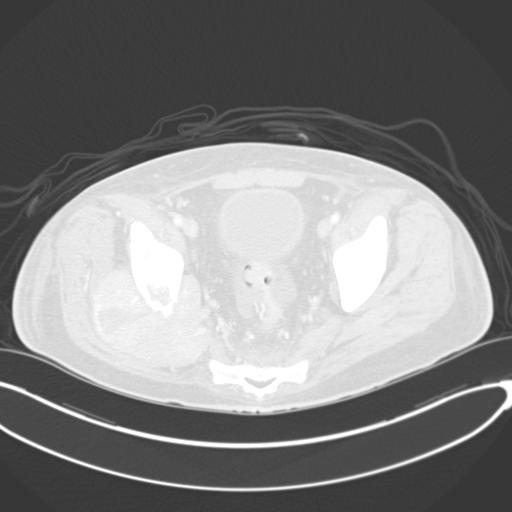
[im 40/145  lung]
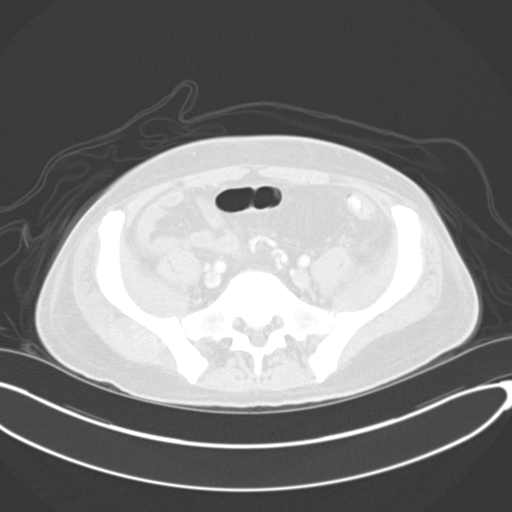
[im 53/145  lung]
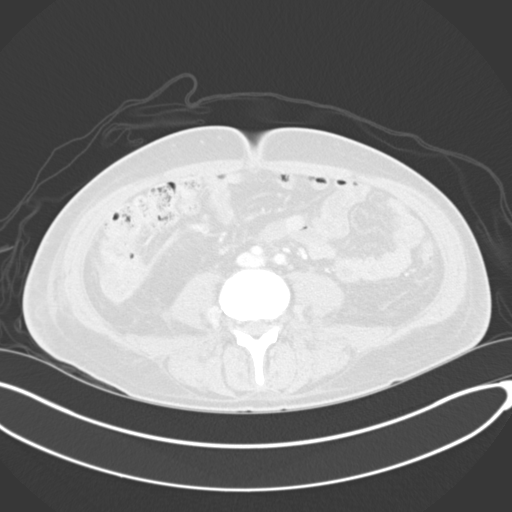
[im 79/145  mediastinal]
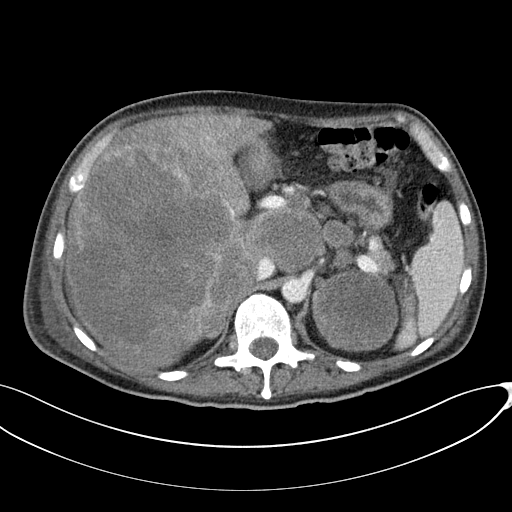
[im 79/145  lung]
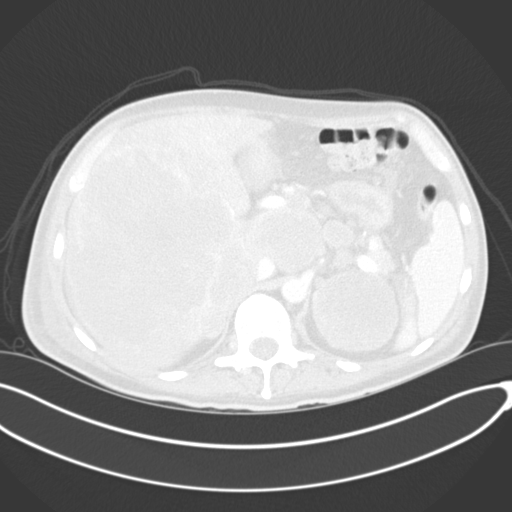
[im 92/145  lung]
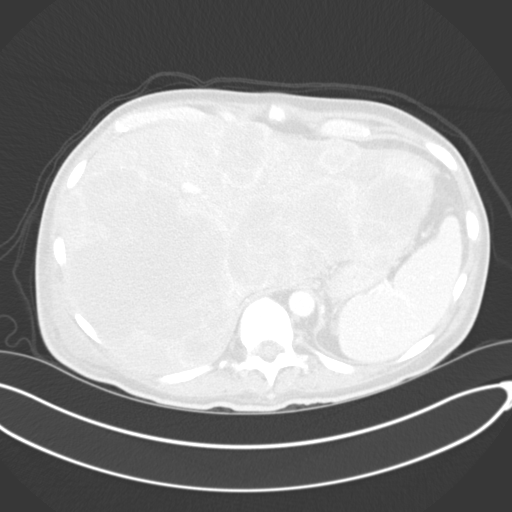
[im 105/145  lung]
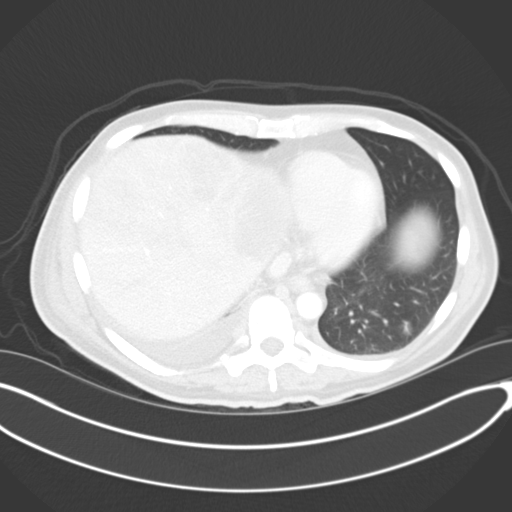
[im 118/145  lung]
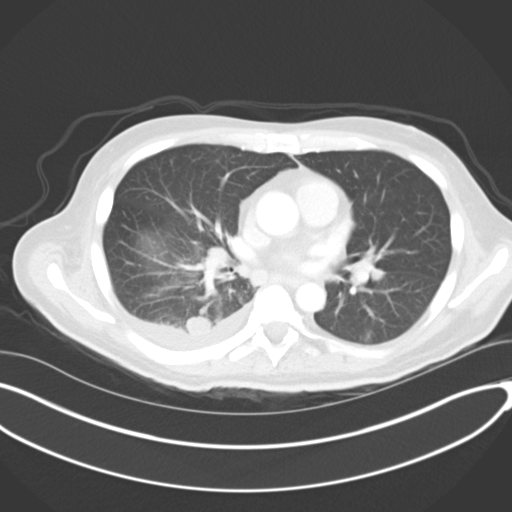
[im 131/145  mediastinal]
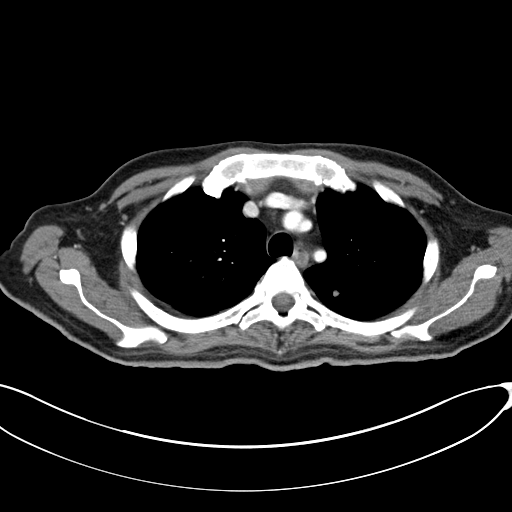
[im 131/145  lung]
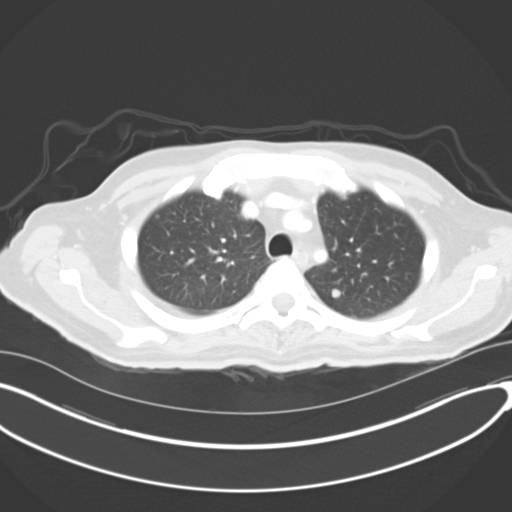

[Series 6: coronals · coronal · 0.89mm/px · 3 of 137 slices shown]
[im 28/137  lung]
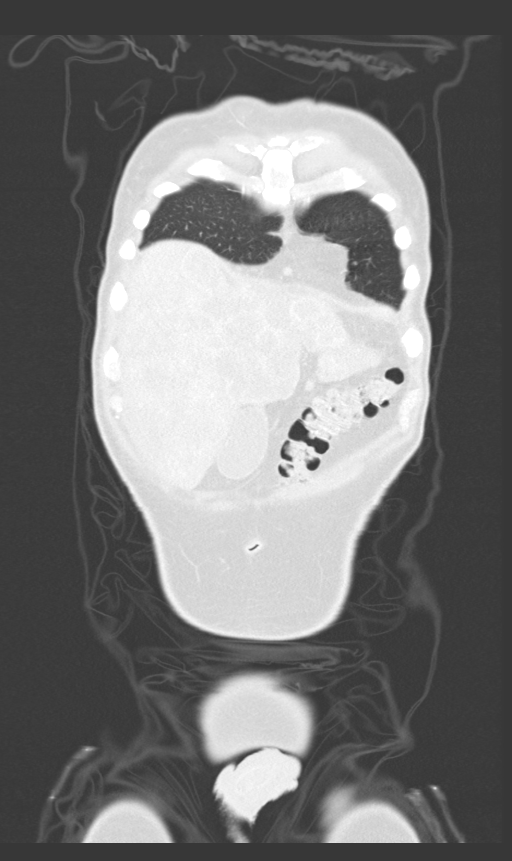
[im 55/137  lung]
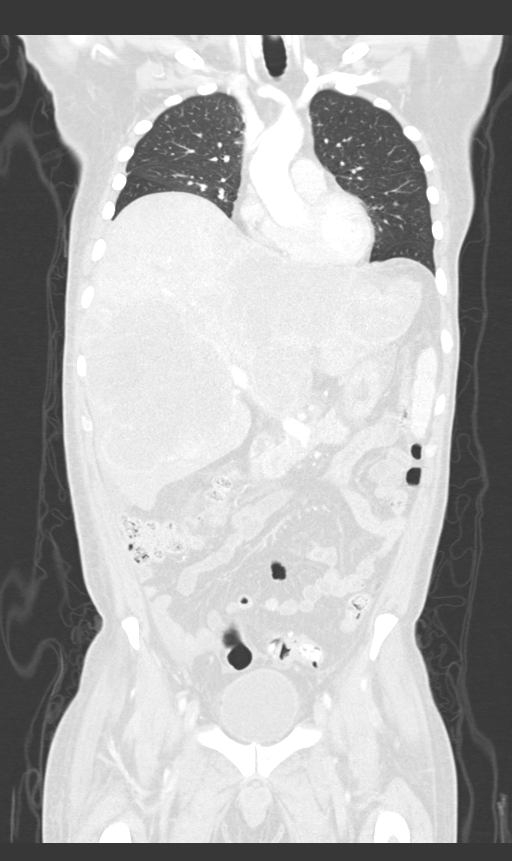
[im 82/137  lung]
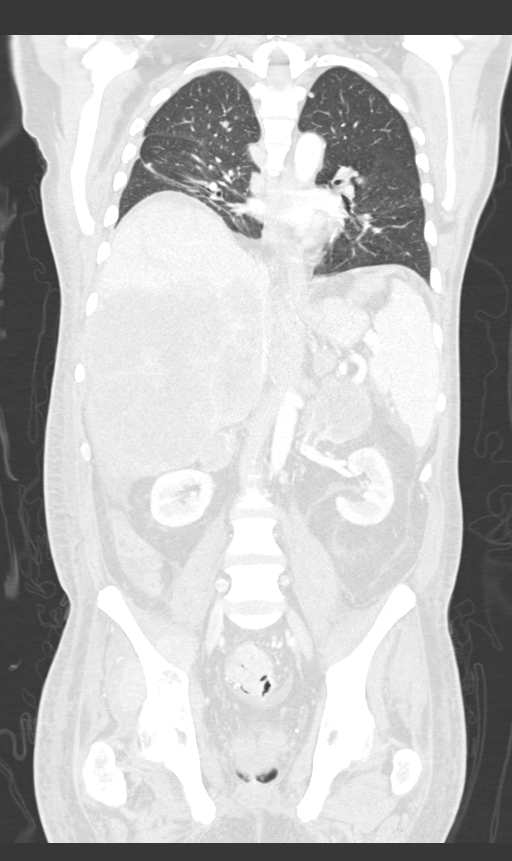

[12 of 36 positions shown; findings below may reference images not displayed]

FINDINGS: CT CHEST FINDINGS

Cardiovascular: The heart is normal in size. No pericardial
effusion. The aorta is normal in caliber. No dissection. The branch
vessels are patent. No atherosclerotic calcifications. The pulmonary
arteries appear normal.

Mediastinum/Nodes: Borderline enlarged subcarinal lymph nodes
suspicious for metastatic disease. The largest node measures 14.5 mm
on image number 27. The esophagus is grossly normal.

Lungs/Pleura: Numerous pulmonary metastatic nodules are noted in
both lungs.

18.5 mm right lower lobe nodule on image number 69.

16.5 mm left lower lobe nodule on image number 72.

6.5 mm left upper lobe nodule on image number 36.

7.5 mm right upper lobe nodule on image number 41

Small right pleural effusion, likely malignant.

Musculoskeletal: No significant bony findings. No lytic or blastic
bone lesions involving the chest.

No supraclavicular or axillary adenopathy.  No chest wall mass.

CT ABDOMEN PELVIS FINDINGS

Hepatobiliary: Innumerable large irregular masses throughout both
lobes of the liver. The largest lesion occupies a good portion of
the right lobe and measures 14.5 x 13 cm on image number 58 of
series 2.

Caudate lobe lesion measures 8.3 x 6.5 cm on image number 58

The largest left lobe lesion measures 10.5 x 8.2 cm on image number
47.

The portal and hepatic veins are patent but the vessels are
significantly compressed. There is severe compression of the
intrahepatic IVC. It is almost completely obliterated.

The gallbladder is grossly normal.  No common bile duct dilatation.

Pancreas: No mass, inflammation or ductal dilatation.

Spleen: Normal size.  Indeterminate 18 mm lesion.

Adrenals/Urinary Tract: Bilateral adrenal gland metastasis. The
right lesion measures 5.5 x 2.7 cm and the left lesion measures
x 6.5 cm.

Both kidneys are unremarkable.

The bladder appears normal.

Stomach/Bowel: The stomach is displaced laterally by the enlarged
liver. No gastric lesions are identified. The duodenum and small
bowel are unremarkable. No obstructive findings or masses.

Circumferential enhancing mass involving the mid sigmoid colon most
consistent with patient's primary neoplasm. This measures
approximately 5 x 5 cm. There are adjacent lymph nodes in the
sigmoid mesocolon likely metastatic disease.

Vascular/Lymphatic: No retroperitoneal lymphadenopathy. There is
periportal and celiac axis adenopathy. Index node on image number 66
of series 2 measures 2 x 2 cm.

Reproductive: The prostate gland and seminal vesicles are
unremarkable.

Other: Small amount of free pelvic fluid noted. I do not see any
obvious omental caking or peritoneal surface lesions. No inguinal
mass or adenopathy.

Musculoskeletal: There is a large destructive lesion involving the
right iliac bone with an associated necrotic appearing soft tissue
mass extending into the extraperitoneal pelvis and into the gluteal
muscles.
IMPRESSION: 1. Widespread metastatic disease involving the lungs, liver, adrenal
glands and abdominal lymph nodes. There is also a single large
destructive metastatic bone lesion involving the right iliac bone.
The primary neoplasm appears to be a 5 cm mid sigmoid colon cancer.
No obstruction at this point but the lesion is circumferential and
fairly constricting appearing.
2. The right iliac bone lesion may be the easiest and safest to
biopsy. Interventional radiology consultation suggested. GI and GI
oncology consults also suggested

## 2020-05-17 IMAGING — US US ABDOMEN LIMITED
1 series · 14 of 25 positions shown · non-contrast
Comparison: None available

CLINICAL DATA: Low back pain

EXAM:
ULTRASOUND ABDOMEN LIMITED RIGHT UPPER QUADRANT

[Series 1: us abdomen limited · 14 of 61 slices shown]
[im 1/61]
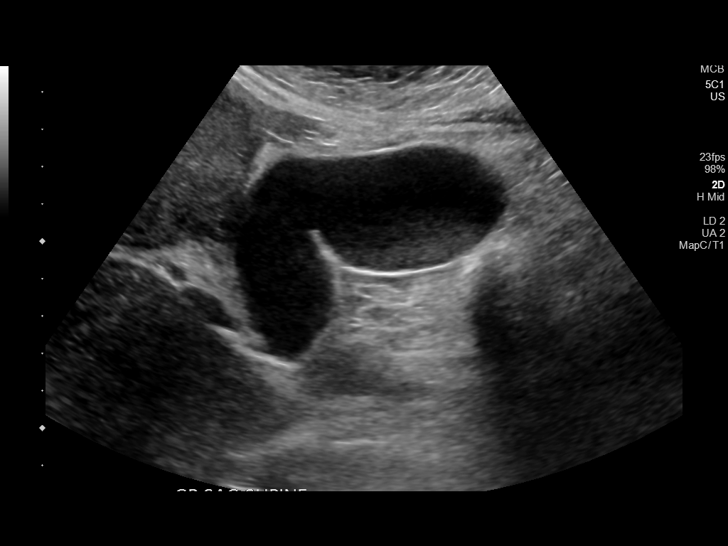
[im 6/61]
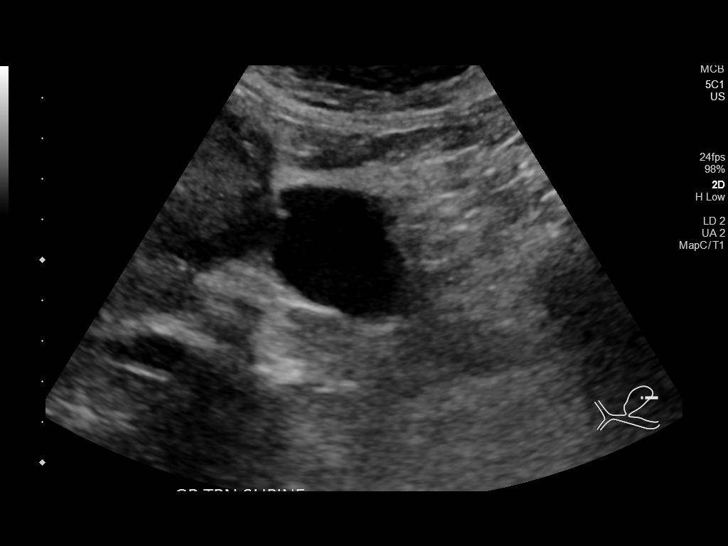
[im 11/61]
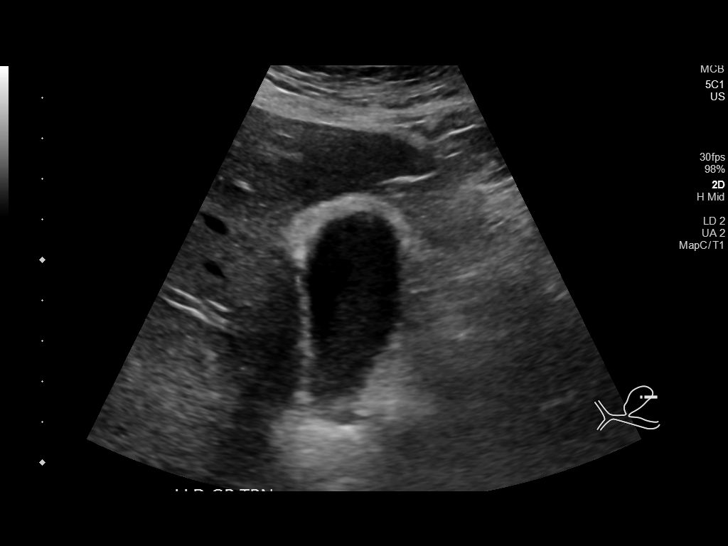
[im 16/61]
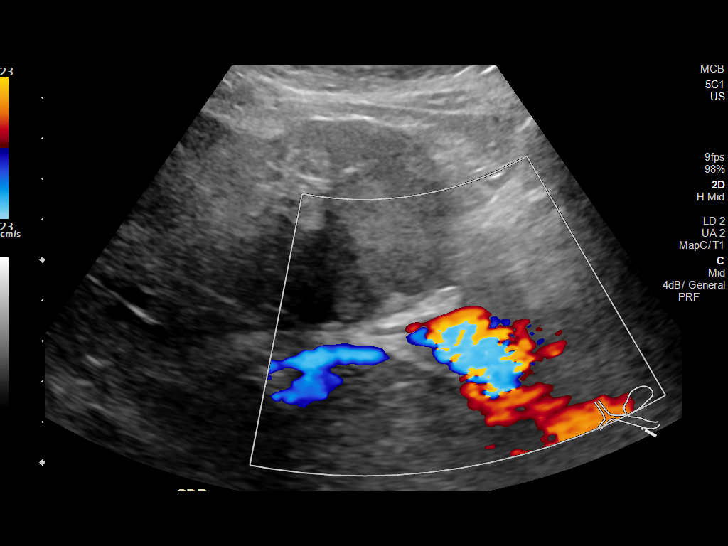
[im 21/61]
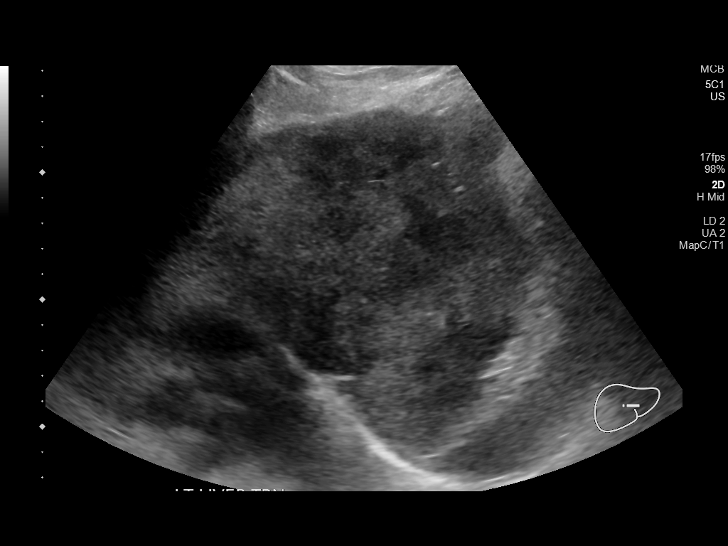
[im 23/61]
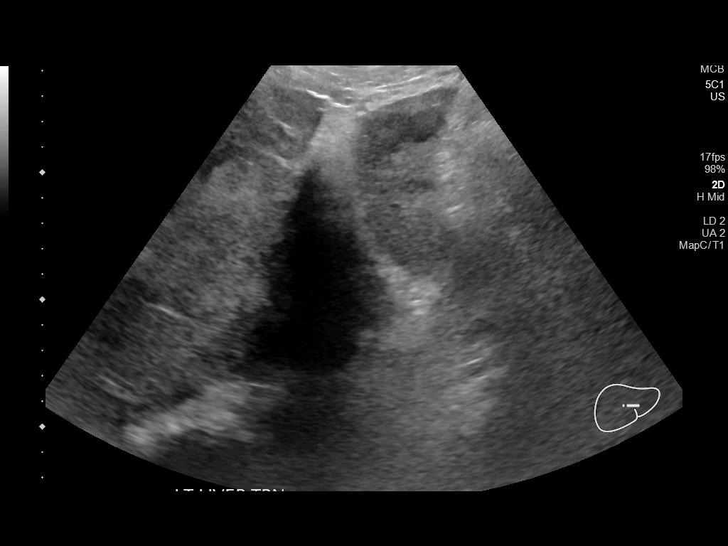
[im 28/61]
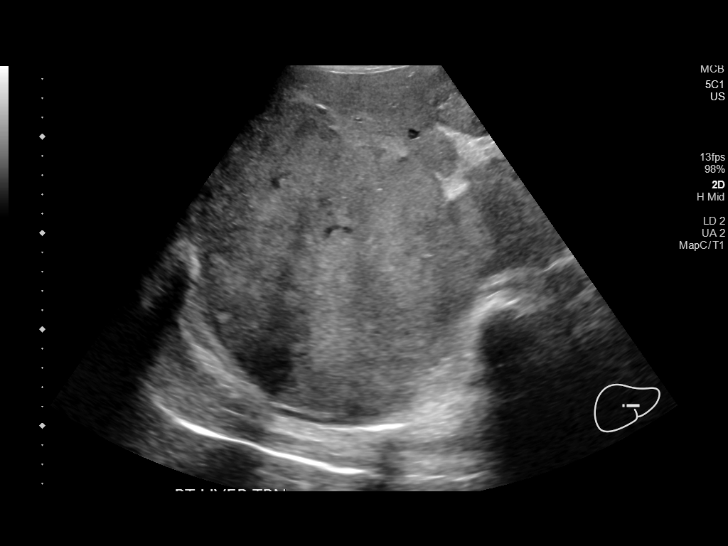
[im 33/61]
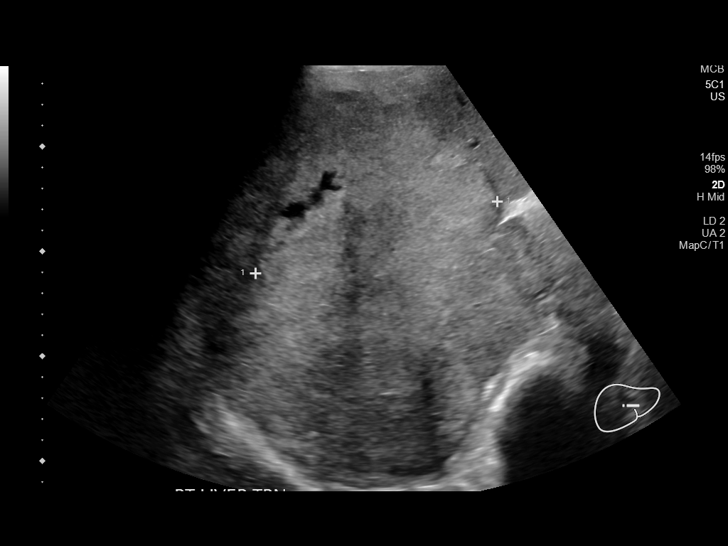
[im 38/61]
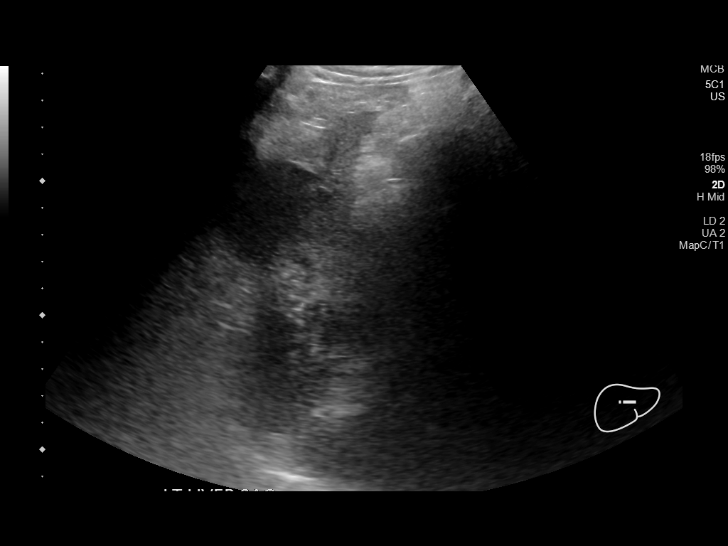
[im 41/61]
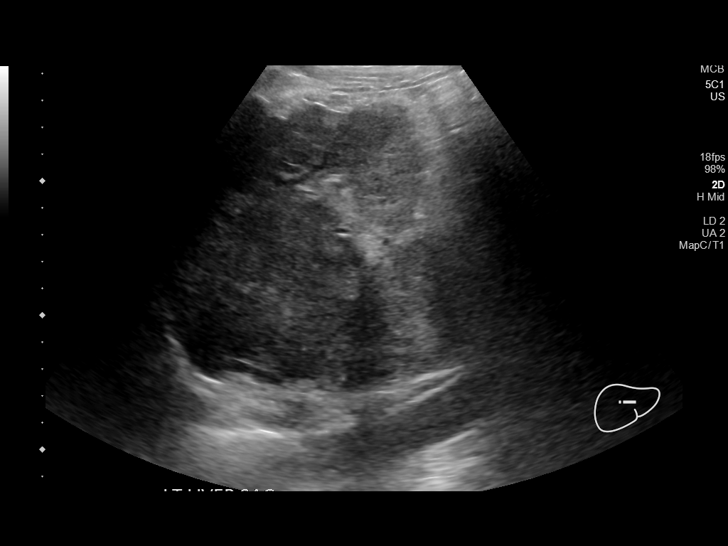
[im 46/61]
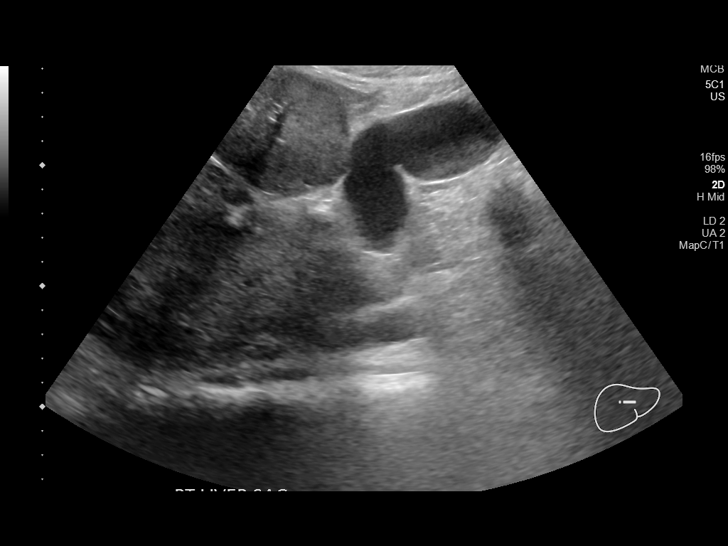
[im 51/61]
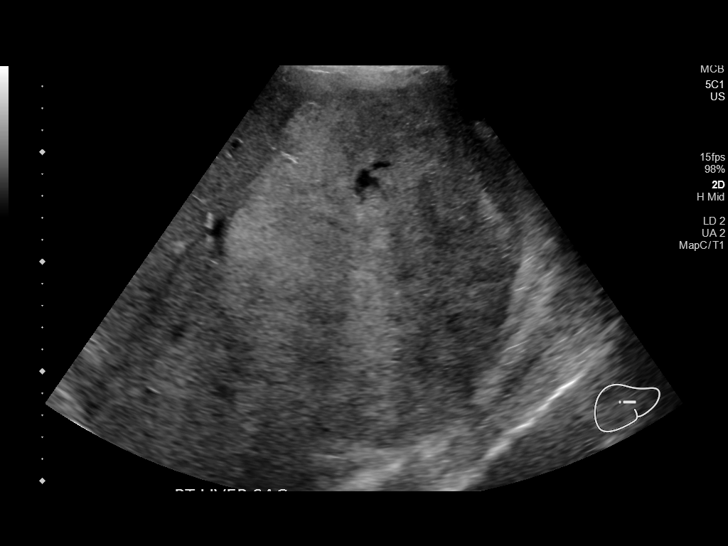
[im 56/61]
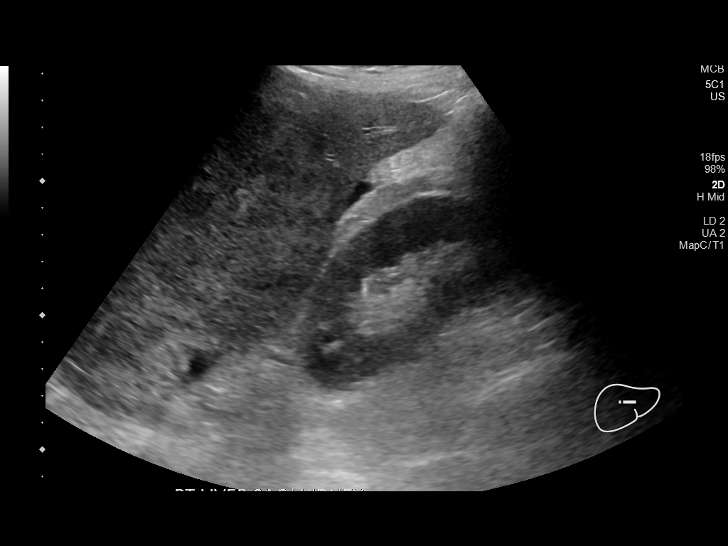
[im 61/61]
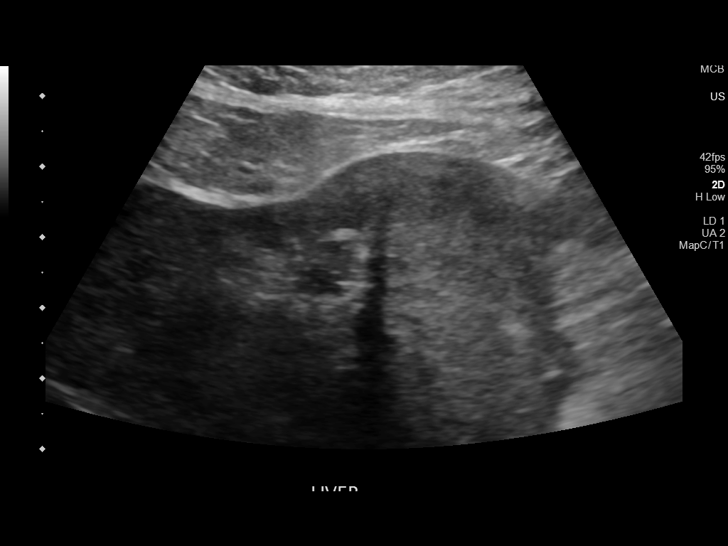

[14 of 25 positions shown; findings below may reference images not displayed]

FINDINGS: Gallbladder:

Normal wall thickness measuring 2.1 mm. No Murphy's sign or
pericholecystic fluid. Gallbladder sludge and tiny subcentimeter
gallstones noted measuring 3 mm.

Common bile duct:

Diameter: 2.1 mm

Liver:

Very heterogeneous liver with mixed echogenicity hepatic lesions
throughout, large lesion in the right lobe measures 15 x 14 x 12 cm.
No intrahepatic biliary dilatation. Portal vein is patent on color
Doppler imaging with normal direction of blood flow towards the
liver.

Other: Small amount of surrounding ascites noted.
IMPRESSION: Heterogeneous liver with multiple mixed echogenicity hepatic lesions
measuring up to 15 cm compatible with underlying liver metastases.

Trace perihepatic ascites

Cholelithiasis and gallbladder sludge
